# Patient Record
Sex: Male | Born: 1995 | Race: Black or African American | Hispanic: No | Marital: Single | Smoking: Never smoker
Health system: Southern US, Community
[De-identification: ages and names within clinical notes are randomized; demographics above are authoritative.]

## PROBLEM LIST (undated history)

## (undated) DIAGNOSIS — F909 Attention-deficit hyperactivity disorder, unspecified type: Secondary | ICD-10-CM

## (undated) DIAGNOSIS — F840 Autistic disorder: Secondary | ICD-10-CM

---
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
## 2018-06-25 DIAGNOSIS — F39 Unspecified mood [affective] disorder: Secondary | ICD-10-CM | POA: Insufficient documentation

## 2018-07-27 LAB — HM HEPATITIS C SCREENING LAB: HM Hepatitis Screen: NEGATIVE

## 2018-07-27 LAB — HM HIV SCREENING LAB: HM HIV Screening: NEGATIVE

## 2018-09-13 ENCOUNTER — Emergency Department
Admission: EM | Admit: 2018-09-13 | Discharge: 2018-09-13 | Disposition: A | Discharge status: Home / Self Care | Payer: Medicaid Other | Attending: Emergency Medicine | Admitting: Emergency Medicine

## 2018-09-13 ENCOUNTER — Encounter: Payer: Self-pay | Admitting: Emergency Medicine

## 2018-09-13 ENCOUNTER — Emergency Department: Payer: Medicaid Other

## 2018-09-13 ENCOUNTER — Other Ambulatory Visit: Payer: Self-pay

## 2018-09-13 DIAGNOSIS — F840 Autistic disorder: Secondary | ICD-10-CM | POA: Insufficient documentation

## 2018-09-13 DIAGNOSIS — F909 Attention-deficit hyperactivity disorder, unspecified type: Secondary | ICD-10-CM | POA: Insufficient documentation

## 2018-09-13 DIAGNOSIS — M7918 Myalgia, other site: Secondary | ICD-10-CM | POA: Diagnosis present

## 2018-09-13 HISTORY — DX: Attention-deficit hyperactivity disorder, unspecified type: F90.9

## 2018-09-13 HISTORY — DX: Autistic disorder: F84.0

## 2018-09-13 MED ORDER — CYCLOBENZAPRINE HCL 10 MG PO TABS
10.0000 mg | ORAL_TABLET | Freq: Once | ORAL | Status: AC
  Administered 2018-09-13: 09:00:00 10 mg via ORAL
  Filled 2018-09-13: qty 1

## 2018-09-13 MED ORDER — CYCLOBENZAPRINE HCL 10 MG PO TABS: 10.0000 mg | ORAL_TABLET | Freq: Three times a day (TID) | ORAL | 0 refills | Status: DC | PRN

## 2018-09-13 MED ORDER — IBUPROFEN 800 MG PO TABS: 800.0000 mg | ORAL_TABLET | Freq: Three times a day (TID) | ORAL | 0 refills | Status: DC | PRN

## 2018-09-13 MED ORDER — IBUPROFEN 800 MG PO TABS
800.0000 mg | ORAL_TABLET | Freq: Once | ORAL | Status: AC
  Administered 2018-09-13: 800 mg via ORAL
  Filled 2018-09-13: qty 1
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
## 2018-12-13 ENCOUNTER — Telehealth: Payer: Self-pay

## 2018-12-13 NOTE — Telephone Encounter (Signed)
Phone call to pt. Pt counseled that we do not do prep at [facility], but we do have a resource for that. Pt provided name and number of [facility]. Pt states he will give them a call to schedule appt.

## 2018-12-23 ENCOUNTER — Encounter (HOSPITAL_COMMUNITY): Payer: Self-pay | Admitting: Emergency Medicine

## 2018-12-23 ENCOUNTER — Emergency Department (HOSPITAL_COMMUNITY)
Admission: EM | Admit: 2018-12-23 | Discharge: 2018-12-24 | Disposition: A | Discharge status: Home / Self Care | Payer: Medicaid Other | Attending: Emergency Medicine | Admitting: Emergency Medicine

## 2018-12-23 ENCOUNTER — Other Ambulatory Visit: Payer: Self-pay

## 2018-12-23 DIAGNOSIS — R300 Dysuria: Secondary | ICD-10-CM | POA: Diagnosis not present

## 2018-12-23 DIAGNOSIS — R309 Painful micturition, unspecified: Secondary | ICD-10-CM | POA: Diagnosis present

## 2018-12-23 LAB — URINALYSIS, ROUTINE W REFLEX MICROSCOPIC
Bilirubin Urine: NEGATIVE
Glucose, UA: NEGATIVE mg/dL
Hgb urine dipstick: NEGATIVE
Ketones, ur: NEGATIVE mg/dL
Leukocytes,Ua: NEGATIVE
Nitrite: NEGATIVE
Protein, ur: NEGATIVE mg/dL
Specific Gravity, Urine: 1.013 (ref 1.005–1.030)
pH: 7 (ref 5.0–8.0)

## 2018-12-23 MED ORDER — PHENAZOPYRIDINE HCL 200 MG PO TABS: 200.0000 mg | ORAL_TABLET | Freq: Three times a day (TID) | ORAL | 0 refills | Status: DC | PRN

## 2018-12-23 NOTE — Discharge Instructions (Signed)
Ultrasound well-hydrated water.  This is very important. Use Tylenol and ibuprofen as needed for pain. Use Pyridium to help with burning. Try to decrease her caffeine intake, this may help with inflammation. If your symptoms are persisting, follow-up with urologist listed below. Return to the emergency room if you develop high fevers, vomiting, abdominal pain, inability urinate, or any new, worsening, concerning symptoms.

## 2018-12-23 NOTE — ED Triage Notes (Signed)
Pt reports he is having painful urination since last Friday. Pt reports he was seen Sunday and treated for possible STD, told he may have a UTI. Pt reports he is still having pain, denies blood in urine, states his urine was cloudy earlier. Denies malodorous urine.

## 2018-12-24 NOTE — ED Notes (Signed)
Discharge instructions discussed with pt. Pt verbalized understanding. Pt to follow up with urology. No questions at this time .

## 2018-12-24 NOTE — ED Provider Notes (Signed)
[facility] EMERGENCY DEPARTMENT Provider Note   CSN: [id number] Arrival date & time: 12/23/18  2054     History   Chief Complaint Chief Complaint  Patient presents with  . painful urination    HPI Jack Wolf is a 23 y.o. male presenting for evaluation of dysuria.   Patient states that the past 6 days he has been having dysuria.  Pain is at the tip of his penis, happens every time he urinates.  He was seen several days ago, was tested for STDs and treated for STDs.  Urine was tested at that time as well.  Patient states all of his results came back negative, but his symptoms not improving.  Patient states nothing makes it better, he has been trying hydration and cranberry juice.  He has not taken anything else.  He denies lesions or bleeding.  He denies fevers, chills, nausea, vomiting, abdominal pain, urinary frequency, or hematuria.  He states he has no medical problems, takes no medications daily.     HPI  Past Medical History:  Diagnosis Date  . ADHD   . Autism     There are no active problems to display for this patient.   History reviewed. No pertinent surgical history.      Home Medications    Prior to Admission medications   Medication Sig Start Date End Date Taking? Authorizing Provider  cyclobenzaprine (FLEXERIL) 10 MG tablet Take 1 tablet (10 mg total) by mouth 3 (three) times daily as needed for muscle spasms. 09/13/18   Earleen Newport, MD  ibuprofen (ADVIL,MOTRIN) 800 MG tablet Take 1 tablet (800 mg total) by mouth every 8 (eight) hours as needed. 09/13/18   Earleen Newport, MD  methylphenidate 36 MG PO CR tablet Take 36 mg by mouth daily.    [provider]  phenazopyridine (PYRIDIUM) 200 MG tablet Take 1 tablet (200 mg total) by mouth 3 (three) times daily as needed for pain. 12/23/18   Sheretta Grumbine, PA-C    Family History History reviewed. No pertinent family history.  Social History Social History    Tobacco Use  . Smoking status: Never Smoker  . Smokeless tobacco: Never Used  Substance Use Topics  . Alcohol use: Never    Frequency: Never  . Drug use: Never     Allergies   Patient has no known allergies.   Review of Systems Review of Systems  Constitutional: Negative for fever.  Gastrointestinal: Negative for abdominal pain, nausea and vomiting.  Genitourinary: Positive for dysuria. Negative for decreased urine volume, difficulty urinating, discharge, flank pain, frequency, genital sores, hematuria, penile pain, penile swelling, scrotal swelling, testicular pain and urgency.     Physical Exam Updated Vital Signs BP (!) 141/81   Pulse 77   Temp 99.3 F (37.4 C) (Oral)   Resp 17   SpO2 96%   Physical Exam Vitals signs and nursing note reviewed. Exam conducted with a chaperone present.  Constitutional:      General: He is not in acute distress.    Appearance: He is well-developed.     Comments: Sitting comfortably in the bed in no acute distress  HENT:     Head: Normocephalic and atraumatic.  Neck:     Musculoskeletal: Normal range of motion.  Cardiovascular:     Rate and Rhythm: Normal rate and regular rhythm.     Pulses: Normal pulses.  Pulmonary:     Effort: Pulmonary effort is normal.  Breath sounds: Normal breath sounds.  Abdominal:     General: There is no distension.     Palpations: Abdomen is soft. There is no mass.     Tenderness: There is no abdominal tenderness. There is no guarding or rebound.     Hernia: There is no hernia in the left inguinal area or right inguinal area.     Comments: No tenderness palpation the abdomen.  Soft without rigidity, guarding, distention.  Negative rebound.  Genitourinary:    Penis: Normal and circumcised. No phimosis, paraphimosis, hypospadias, erythema, tenderness, discharge, swelling or lesions.      Scrotum/Testes: Normal.        Right: Tenderness or swelling not present.        Left: Tenderness or swelling  not present.     Epididymis:     Right: Normal.     Left: Normal.     Comments: No obvious abnormality of the GU exam.  No lesions.  No discharge.  No swelling or tenderness.  No inguinal lymphadenopathy or hernias. Musculoskeletal: Normal range of motion.  Lymphadenopathy:     Lower Body: No right inguinal adenopathy. No left inguinal adenopathy.  Skin:    General: Skin is warm.     Findings: No rash.  Neurological:     Mental Status: He is alert and oriented to person, place, and time.      ED Treatments / Results  Labs (all labs ordered are listed, but only abnormal results are displayed) Labs Reviewed  URINALYSIS, ROUTINE W REFLEX MICROSCOPIC    EKG None  Radiology No results found.  Procedures Procedures (including critical care time)  Medications Ordered in ED Medications - No data to display   Initial Impression / Assessment and Plan / ED Course  I have reviewed the triage vital signs and the nursing notes.  Pertinent labs & imaging results that were available during my care of the patient were reviewed by me and considered in my medical decision making (see chart for details).        Patient presenting for evaluation of dysuria.  Physical exam reassuring, appears nontoxic.  No obvious abnormality on GU exam.  Patient recently treated for STDs, test were negative.  Urine today is completely normal, doubt UTI.  Doubt kidney stone, there is no blood in his urine.  No obvious skin infection at the glans of the penis.  Discussed dysuria may be due to inflammation instead of infection.  Encouraged hydration.  Will give Pyridium for dysuria, and have patient follow-up with urology if symptoms not improving.  At this time, patient appears safe for discharge.  Return precautions given.  Patient states he understands and agrees to plan.   Final Clinical Impressions(s) / ED Diagnoses   Final diagnoses:  Dysuria    ED Discharge Orders         Ordered     phenazopyridine (PYRIDIUM) 200 MG tablet  3 times daily PRN     12/23/18 2356           Alveria ApleyCaccavale, Sowmya Partridge, PA-C 12/24/18 0019    Nira Connardama, Pedro Eduardo, MD 12/24/18 2026

## 2019-07-04 ENCOUNTER — Encounter: Payer: Self-pay | Admitting: Emergency Medicine

## 2019-07-04 ENCOUNTER — Other Ambulatory Visit: Payer: Self-pay

## 2019-07-04 DIAGNOSIS — Z5321 Procedure and treatment not carried out due to patient leaving prior to being seen by health care provider: Secondary | ICD-10-CM | POA: Insufficient documentation

## 2019-07-04 DIAGNOSIS — R519 Headache, unspecified: Secondary | ICD-10-CM | POA: Diagnosis not present

## 2019-07-04 DIAGNOSIS — R112 Nausea with vomiting, unspecified: Secondary | ICD-10-CM | POA: Diagnosis not present

## 2019-07-04 DIAGNOSIS — J029 Acute pharyngitis, unspecified: Secondary | ICD-10-CM | POA: Insufficient documentation

## 2019-07-04 DIAGNOSIS — R439 Unspecified disturbances of smell and taste: Secondary | ICD-10-CM | POA: Insufficient documentation

## 2019-07-04 DIAGNOSIS — R109 Unspecified abdominal pain: Secondary | ICD-10-CM | POA: Diagnosis not present

## 2019-07-04 LAB — COMPREHENSIVE METABOLIC PANEL
ALT: 32 U/L (ref 0–44)
AST: 23 U/L (ref 15–41)
Albumin: 4.7 g/dL (ref 3.5–5.0)
Alkaline Phosphatase: 89 U/L (ref 38–126)
Anion gap: 12 (ref 5–15)
BUN: 10 mg/dL (ref 6–20)
CO2: 24 mmol/L (ref 22–32)
Calcium: 9.3 mg/dL (ref 8.9–10.3)
Chloride: 103 mmol/L (ref 98–111)
Creatinine, Ser: 1.18 mg/dL (ref 0.61–1.24)
GFR calc Af Amer: >60 mL/min (ref >60)
GFR calc non Af Amer: >60 mL/min (ref >60)
Glucose, Bld: 111 mg/dL — ABNORMAL HIGH (ref 70–99)
Potassium: 3.9 mmol/L (ref 3.5–5.1)
Sodium: 139 mmol/L (ref 135–145)
Total Bilirubin: 0.9 mg/dL (ref 0.3–1.2)
Total Protein: 8.8 g/dL — ABNORMAL HIGH (ref 6.5–8.1)

## 2019-07-04 LAB — CBC
HCT: 44.6 % (ref 39.0–52.0)
Hemoglobin: 15 g/dL (ref 13.0–17.0)
MCH: 26 pg (ref 26.0–34.0)
MCHC: 33.6 g/dL (ref 30.0–36.0)
MCV: 77.3 fL — ABNORMAL LOW (ref 80.0–100.0)
Platelets: 213 10*3/uL (ref 150–400)
RBC: 5.77 MIL/uL (ref 4.22–5.81)
RDW: 13.8 % (ref 11.5–15.5)
WBC: 5.6 10*3/uL (ref 4.0–10.5)
nRBC: 0 % (ref 0.0–0.2)

## 2019-07-04 LAB — LIPASE, BLOOD: Lipase: 22 U/L (ref 11–51)

## 2019-07-04 MED ORDER — SODIUM CHLORIDE 0.9% FLUSH: 3.0000 mL | Freq: Once | INTRAVENOUS | Status: DC

## 2019-07-04 NOTE — ED Triage Notes (Signed)
Pt to ED from home c/o sore throat, headache, loss of taste and smell since last Thursday.  Also states abd pain today with one episodes of nausea and vomiting just after arrival to ED.

## 2019-07-05 ENCOUNTER — Emergency Department
Admission: EM | Admit: 2019-07-05 | Discharge: 2019-07-05 | Disposition: A | Discharge status: Home / Self Care | Payer: Medicaid Other | Attending: Emergency Medicine | Admitting: Emergency Medicine

## 2019-09-22 ENCOUNTER — Other Ambulatory Visit: Payer: Self-pay

## 2019-09-22 ENCOUNTER — Ambulatory Visit: Payer: Medicaid Other | Admitting: Dermatology

## 2019-09-22 DIAGNOSIS — L700 Acne vulgaris: Secondary | ICD-10-CM

## 2019-09-22 DIAGNOSIS — L730 Acne keloid: Secondary | ICD-10-CM

## 2019-09-22 MED ORDER — KETOCONAZOLE 2 % EX SHAM: 1.0000 "application " | MEDICATED_SHAMPOO | CUTANEOUS | 3 refills | Status: DC

## 2019-09-22 MED ORDER — TAZORAC 0.05 % EX CREA: TOPICAL_CREAM | Freq: Every day | CUTANEOUS | 3 refills | Status: DC

## 2019-09-22 MED ORDER — DOXYCYCLINE HYCLATE 100 MG PO CAPS: 100.0000 mg | ORAL_CAPSULE | Freq: Every day | ORAL | 3 refills | Status: DC

## 2019-09-22 NOTE — Progress Notes (Signed)
   New Patient Visit  Subjective  Jack Wolf is a 24 y.o. male who presents for the following: Acne (of face and post scalp. Breakouts may be caused by razors.).    Objective  Well appearing patient in no apparent distress; mood and affect are within normal limits.  A focused examination was performed including face, neck, post scalp. Relevant physical exam findings are noted in the Assessment and Plan.  Objective  Post scalp: Acne papules and keloidal papules.  Images    Objective  Head - Anterior (Face): Pustules, almost confluent.  Images        Assessment & Plan  Acne keloidalis nuchae Post scalp  /Folliculitis  Ordered Medications: ketoconazole (NIZORAL) 2 % shampoo doxycycline (VIBRAMYCIN) 100 MG capsule tazarotene (TAZORAC) 0.05 % cream  Acne vulgaris Head - Anterior (Face)  May consider Isotretinoin if not improving. Information give today.  ketoconazole (NIZORAL) 2 % shampoo - Head - Anterior (Face)  doxycycline (VIBRAMYCIN) 100 MG capsule - Head - Anterior (Face)  tazarotene (TAZORAC) 0.05 % cream - Head - Anterior (Face)  Return in about 2 months (around 11/22/2019).   I, Joanie Coddington, CMA, am acting as scribe for Armida Sans, MD .  Documentation: I have reviewed the above documentation for accuracy and completeness, and I agree with the above.  Armida Sans, MD

## 2019-09-23 ENCOUNTER — Encounter: Payer: Self-pay | Admitting: Dermatology

## 2019-10-05 ENCOUNTER — Telehealth: Payer: Self-pay

## 2019-10-05 NOTE — Telephone Encounter (Signed)
Tazorac not covered with medicaid insurance until two preferred have been tried.  -Duac, Clindamycin Solution, Differin Gel, Epiduo Gel, Erythromycin Solution, Retin-A, Retin-A Micro.

## 2019-10-06 NOTE — Telephone Encounter (Signed)
Send Retin A micro 0.1% cr

## 2019-10-07 MED ORDER — TRETINOIN 0.1 % EX CREA: TOPICAL_CREAM | Freq: Every evening | CUTANEOUS | 2 refills | Status: AC

## 2019-10-07 NOTE — Telephone Encounter (Signed)
Retina-A sent in. Called patient but no answer and no voicemail available.

## 2019-10-10 NOTE — Telephone Encounter (Signed)
Called patient. No answer and no voicemail available.

## 2019-10-11 NOTE — Telephone Encounter (Signed)
Called patient for the third and final time. No answer and no voicemail.

## 2019-10-19 ENCOUNTER — Telehealth: Payer: Self-pay

## 2019-10-19 ENCOUNTER — Other Ambulatory Visit: Payer: Self-pay

## 2019-10-19 MED ORDER — KETOCONAZOLE 2 % EX SHAM: MEDICATED_SHAMPOO | CUTANEOUS | 0 refills | Status: DC

## 2019-10-19 NOTE — Telephone Encounter (Signed)
Refill Ketoconazole 2% shampoo   Ok erx'd Ketoconazole shampoo to [facility]

## 2019-11-15 ENCOUNTER — Ambulatory Visit (LOCAL_COMMUNITY_HEALTH_CENTER): Payer: Medicaid Other

## 2019-11-15 ENCOUNTER — Other Ambulatory Visit: Payer: Self-pay

## 2019-11-15 DIAGNOSIS — Z7185 Encounter for immunization safety counseling: Secondary | ICD-10-CM

## 2019-11-15 DIAGNOSIS — Z7189 Other specified counseling: Secondary | ICD-10-CM

## 2019-11-15 NOTE — Progress Notes (Signed)
Pt to clinic requesting Hep A vaccine. Pt counseled that he already completed Hep A series. RN offered pt Td/Tdap booster; was due April 2021. Pt declined Td and Tdap vaccine today but did accept VIS statement for both to study; plans to reschedule immunization appt. Provided copy of [facility] to pt.

## 2019-11-26 ENCOUNTER — Encounter: Payer: Self-pay | Admitting: Emergency Medicine

## 2019-11-26 ENCOUNTER — Other Ambulatory Visit: Payer: Self-pay

## 2019-11-26 ENCOUNTER — Emergency Department
Admission: EM | Admit: 2019-11-26 | Discharge: 2019-11-26 | Disposition: A | Discharge status: Home / Self Care | Payer: Medicaid Other | Attending: Emergency Medicine | Admitting: Emergency Medicine

## 2019-11-26 DIAGNOSIS — R300 Dysuria: Secondary | ICD-10-CM | POA: Insufficient documentation

## 2019-11-26 DIAGNOSIS — Z79899 Other long term (current) drug therapy: Secondary | ICD-10-CM | POA: Insufficient documentation

## 2019-11-26 DIAGNOSIS — Z113 Encounter for screening for infections with a predominantly sexual mode of transmission: Secondary | ICD-10-CM

## 2019-11-26 LAB — URINALYSIS, COMPLETE (UACMP) WITH MICROSCOPIC
Bacteria, UA: NONE SEEN
Bilirubin Urine: NEGATIVE
Glucose, UA: NEGATIVE mg/dL
Ketones, ur: NEGATIVE mg/dL
Leukocytes,Ua: NEGATIVE
Nitrite: NEGATIVE
Protein, ur: 30 mg/dL — AB
Specific Gravity, Urine: 1.021 (ref 1.005–1.030)
pH: 5 (ref 5.0–8.0)

## 2019-11-26 LAB — CHLAMYDIA/NGC RT PCR (ARMC ONLY)
Chlamydia Tr: NOT DETECTED
N gonorrhoeae: NOT DETECTED

## 2019-11-26 NOTE — ED Notes (Signed)
See triage note  Presents with some penile irritation  No discharge  But states having discomfort with urination

## 2019-11-26 NOTE — ED Triage Notes (Signed)
Pt here for STD test. Has been having irritation at penis. + dysuria but denies discharge. Unsure if he has been exposed to anything, but would like to get tested.

## 2019-11-26 NOTE — ED Provider Notes (Signed)
Jack Wolf Provider Note   ____________________________________________   First MD Initiated Contact with Patient 11/26/19 [phone]     (approximate)  I have reviewed the triage vital signs and the nursing notes.   HISTORY  Chief Complaint STD test   HPI Jack Wolf is a 24 y.o. male presents to the ED with complaint of penile irritation but no discharge.  Patient states that he had unprotected sex approximately 7 to 10 days ago and that his partner is having no symptoms.  Patient denies any other symptoms.      Past Medical History:  Diagnosis Date  . ADHD   . Autism     There are no problems to display for this patient.   History reviewed. No pertinent surgical history.  Prior to Admission medications   Medication Sig Start Date End Date Taking? Authorizing Provider  cetirizine (ZYRTEC) 10 MG tablet Take by mouth.    [provider]  ketoconazole (NIZORAL) 2 % cream Apply topically.    [provider]  ketoconazole (NIZORAL) 2 % shampoo Apply 1 application topically 3 (three) times a week. Wash face and post neck/scalp 09/23/19   Deirdre Evener, MD  ketoconazole (NIZORAL) 2 % shampoo Shampoo scalp 2-3 times a week 10/19/19   Deirdre Evener, MD  methylphenidate 36 MG PO CR tablet Take 36 mg by mouth daily.    [provider]  tazarotene (TAZORAC) 0.05 % cream Apply topically at bedtime. To face and post neck 09/22/19   Deirdre Evener, MD  tretinoin (RETIN-A) 0.1 % cream Apply topically at bedtime. 10/07/19 10/06/20  Deirdre Evener, MD    Allergies Patient has no known allergies.  History reviewed. No pertinent family history.  Social History Social History   Tobacco Use  . Smoking status: Never Smoker  . Smokeless tobacco: Never Used  Substance Use Topics  . Alcohol use: Never  . Drug use: Never    Review of Systems Constitutional: No fever/chills Eyes: No visual  changes. ENT: No sore throat. Cardiovascular: Denies chest pain. Respiratory: Denies shortness of breath. Gastrointestinal: No abdominal pain.  No nausea, no vomiting.  Genitourinary: Positive for penile irritation.  Negative for frequency, discharge or hesitancy. Musculoskeletal: Negative for back pain. Skin: Negative for rash. Neurological: Negative for  focal weakness or numbness. ____________________________________________   PHYSICAL EXAM:  VITAL SIGNS: ED Triage Vitals [11/26/19 0840]  Enc Vitals Group     BP      Pulse      Resp      Temp      Temp src      SpO2      Weight (!) 330 lb (149.7 kg)     Height 5\' 11"  (1.803 m)     Head Circumference      Peak Flow      Pain Score 0     Pain Loc      Pain Edu?      Excl. in GC?     Constitutional: Alert and oriented. Well appearing and in no acute distress. Eyes: Conjunctivae are normal.  Head: Atraumatic. Neck: No stridor.   Cardiovascular: Normal rate, regular rhythm. Grossly normal heart sounds.  Good peripheral circulation. Respiratory: Normal respiratory effort.  No retractions. Lungs CTAB. Musculoskeletal: Moves upper and lower extremities no difficulty.  Normal gait was noted. Neurologic:  Normal speech and language. No gross focal neurologic deficits are appreciated. No gait instability. Skin:  Skin is  warm, dry and intact. Psychiatric: Mood and affect are normal. Speech and behavior are normal.  ____________________________________________   LABS (all labs ordered are listed, but only abnormal results are displayed)  Labs Reviewed  URINALYSIS, COMPLETE (UACMP) WITH MICROSCOPIC - Abnormal; Notable for the following components:      Result Value   Color, Urine YELLOW (*)    APPearance CLEAR (*)    Hgb urine dipstick SMALL (*)    Protein, ur 30 (*)    All other components within normal limits  CHLAMYDIA/NGC RT PCR ([facility] ONLY)   PROCEDURES  Procedure(s) performed (including Critical  Care):  Procedures   ____________________________________________   INITIAL IMPRESSION / ASSESSMENT AND PLAN / ED COURSE  As part of my medical decision making, I reviewed the following data within the electronic MEDICAL RECORD NUMBER Notes from prior ED visits and [place] Controlled Substance Database  47-year-old male presents to the ED with complaint of penile irritation but no discharge.  He states he had unprotected sex approximately 7 to 10 days ago with his partner having no symptoms.  Urinalysis was negative.  Patient was told that we would call if his gonorrhea and chlamydia test was positive.  Patient was asked twice if he gave registration a current phone number where he can be reached and he both times said yes.  This provider tried to call the patient after receiving the results of his test.  The number that he listed as his contact phone was "a nonworking number".  He also was given information about going to the [facility] if any further STD testing was needed.  ____________________________________________   FINAL CLINICAL IMPRESSION(S) / ED DIAGNOSES  Final diagnoses:  Dysuria  Screen for STD (sexually transmitted disease)     ED Discharge Orders    None       Note:  This document was prepared using Dragon voice recognition software and may include unintentional dictation errors.    Johnn Hai, PA-C 11/26/19 1140    Harvest Dark, MD 11/26/19 1539

## 2019-11-26 NOTE — Discharge Instructions (Signed)
Follow-up with the [facility] department STD clinic.  You will need to call and make an appointment.  Your urine test was negative for any obvious infection.  If your chlamydia and gonorrhea test is positive you will get a phone call.

## 2019-12-05 ENCOUNTER — Encounter: Payer: Self-pay | Admitting: Emergency Medicine

## 2019-12-05 ENCOUNTER — Emergency Department: Payer: Medicaid Other

## 2019-12-05 ENCOUNTER — Emergency Department
Admission: EM | Admit: 2019-12-05 | Discharge: 2019-12-05 | Disposition: A | Discharge status: Home / Self Care | Payer: Medicaid Other | Attending: Emergency Medicine | Admitting: Emergency Medicine

## 2019-12-05 ENCOUNTER — Other Ambulatory Visit: Payer: Self-pay

## 2019-12-05 DIAGNOSIS — J069 Acute upper respiratory infection, unspecified: Secondary | ICD-10-CM | POA: Diagnosis not present

## 2019-12-05 DIAGNOSIS — F840 Autistic disorder: Secondary | ICD-10-CM | POA: Diagnosis not present

## 2019-12-05 DIAGNOSIS — R0981 Nasal congestion: Secondary | ICD-10-CM | POA: Diagnosis present

## 2019-12-05 MED ORDER — PSEUDOEPH-BROMPHEN-DM 30-2-10 MG/5ML PO SYRP: 5.0000 mL | ORAL_SOLUTION | Freq: Four times a day (QID) | ORAL | 0 refills | Status: DC | PRN

## 2019-12-05 NOTE — ED Notes (Signed)
See triage note  Presents with cough  States developed fever,sore throat and cough last weds  Afebrile on arrival

## 2019-12-05 NOTE — ED Provider Notes (Signed)
[facility] Emergency Department Provider Note  ____________________________________________   First MD Initiated Contact with Patient 12/05/19 1325     (approximate)  I have reviewed the triage vital signs and the nursing notes.   HISTORY  Chief Complaint Cough and Nasal Congestion    HPI Jack Wolf is a 24 y.o. male presents to the ED with complaint of nasal congestion, nonproductive cough, fever last week along with headache and sore throat.  Patient states that he is taken some over-the-counter allergy medicine for allergies.  He denies being a smoker.  He also denies any GI symptoms and no exposure to Covid.  He denies any loss of taste or smell and continues to have an appetite.       Past Medical History:  Diagnosis Date  . ADHD   . Autism     There are no problems to display for this patient.   History reviewed. No pertinent surgical history.  Prior to Admission medications   Medication Sig Start Date End Date Taking? Authorizing Provider  brompheniramine-pseudoephedrine-DM 30-2-10 MG/5ML syrup Take 5 mLs by mouth 4 (four) times daily as needed. 12/05/19   Tommi Rumps, PA-C  cetirizine (ZYRTEC) 10 MG tablet Take by mouth.    [provider]  ketoconazole (NIZORAL) 2 % cream Apply topically.    [provider]  ketoconazole (NIZORAL) 2 % shampoo Apply 1 application topically 3 (three) times a week. Wash face and post neck/scalp 09/23/19   Deirdre Evener, MD  ketoconazole (NIZORAL) 2 % shampoo Shampoo scalp 2-3 times a week 10/19/19   Deirdre Evener, MD  methylphenidate 36 MG PO CR tablet Take 36 mg by mouth daily.    [provider]  tazarotene (TAZORAC) 0.05 % cream Apply topically at bedtime. To face and post neck 09/22/19   Deirdre Evener, MD  tretinoin (RETIN-A) 0.1 % cream Apply topically at bedtime. 10/07/19 10/06/20  Deirdre Evener, MD    Allergies Patient has no known allergies.  History  reviewed. No pertinent family history.  Social History Social History   Tobacco Use  . Smoking status: Never Smoker  . Smokeless tobacco: Never Used  Substance Use Topics  . Alcohol use: Never  . Drug use: Never    Review of Systems Constitutional: No fever/chills Eyes: No visual changes. ENT: Positive sore throat.  Positive nasal congestion. Cardiovascular: Denies chest pain. Respiratory: Denies shortness of breath.  Positive nonproductive cough. Gastrointestinal: No abdominal pain.  No nausea, no vomiting.  No diarrhea.  Musculoskeletal: Negative for back pain. Skin: Negative for rash. Neurological: Positive for headaches, negative for focal weakness or numbness.  ____________________________________________   PHYSICAL EXAM:  VITAL SIGNS: ED Triage Vitals  Enc Vitals Group     BP 12/05/19 1237 (!) 146/82     Pulse Rate 12/05/19 1237 85     Resp 12/05/19 1237 18     Temp 12/05/19 1237 98.9 F (37.2 C)     Temp Source 12/05/19 1237 Oral     SpO2 12/05/19 1237 100 %     Weight 12/05/19 1232 (!) 320 lb (145.2 kg)     Height 12/05/19 1232 5\' 11"  (1.803 m)     Head Circumference --      Peak Flow --      Pain Score 12/05/19 1232 7     Pain Loc --      Pain Edu? --      Excl. in GC? --  Constitutional: Alert and oriented. Well appearing and in no acute distress. Eyes: Conjunctivae are normal.  Head: Atraumatic. Nose: Mild congestion/no present rhinnorhea. Mouth/Throat: Mucous membranes are moist.  Oropharynx non-erythematous.  Uvula is midline. Neck: No stridor.   Cardiovascular: Normal rate, regular rhythm. Grossly normal heart sounds.  Good peripheral circulation. Respiratory: Normal respiratory effort.  No retractions. Lungs CTAB. Gastrointestinal: Soft and nontender. No distention.  Musculoskeletal: Moves upper and lower extremities with any difficulty.  Normal gait was noted. Neurologic:  Normal speech and language. No gross focal neurologic deficits are  appreciated. No gait instability. Skin:  Skin is warm, dry and intact. No rash noted. Psychiatric: Mood and affect are normal. Speech and behavior are normal.  ____________________________________________   LABS (all labs ordered are listed, but only abnormal results are displayed)  Labs Reviewed - No data to display  RADIOLOGY  Official radiology report(s): DG Chest 2 View  Result Date: 12/05/2019 CLINICAL DATA:  Cough and nasal congestion. EXAM: CHEST - 2 VIEW COMPARISON:  09/13/2018 FINDINGS: The heart size and mediastinal contours are within normal limits. Both lungs are clear. The visualized skeletal structures are unremarkable. IMPRESSION: No active cardiopulmonary disease. Electronically Signed   By: Norva Pavlov M.D.   On: 12/05/2019 13:14    ____________________________________________   PROCEDURES  Procedure(s) performed (including Critical Care):  Procedures   ____________________________________________   INITIAL IMPRESSION / ASSESSMENT AND PLAN / ED COURSE  As part of my medical decision making, I reviewed the following data within the electronic MEDICAL RECORD NUMBER Notes from prior ED visits and [place] Controlled Substance Database  24 year old male presents to the ED with complaint of upper respiratory symptoms with history of nasal congestion, nonproductive cough sore throat and headache.  Patient states that he had a subjective fever last week.  He denies any loss of taste or smell.  He denies any exposure to Covid.  He has taken over-the-counter allergy medication with minimal relief.  No coughing was noted while patient was present in the ED.  Exam is consistent with a viral URI.  Patient is encouraged to increase fluids, take Tylenol if needed and Bromfed DM was sent to his pharmacy.  He is to follow-up with his PCP if any continued problems.   ____________________________________________   FINAL CLINICAL IMPRESSION(S) / ED DIAGNOSES  Final diagnoses:    Viral URI with cough     ED Discharge Orders         Ordered    brompheniramine-pseudoephedrine-DM 30-2-10 MG/5ML syrup  4 times daily PRN     Discontinue  Reprint     12/05/19 1407           Note:  This document was prepared using Dragon voice recognition software and may include unintentional dictation errors.    Tommi Rumps, PA-C 12/05/19 1458    Sharman Cheek, MD 12/05/19 [phone]

## 2019-12-05 NOTE — ED Triage Notes (Signed)
Here for nasal congestion, non productive cough, fever last Wednesday, headache and sore throat. Unlabored, NAD at this time.  Chest wall pain with cough, no pain prior to cough.

## 2019-12-05 NOTE — Discharge Instructions (Signed)
Follow-up with your primary care provider if any continued problems or concerns.  Begin taking the Bromfed-DM as needed for cough, congestion and sore throat.  Increase fluids.  Tylenol as needed for sore throat.

## 2019-12-12 ENCOUNTER — Other Ambulatory Visit: Payer: Self-pay

## 2019-12-12 ENCOUNTER — Ambulatory Visit (INDEPENDENT_AMBULATORY_CARE_PROVIDER_SITE_OTHER): Payer: Medicaid Other | Admitting: Dermatology

## 2019-12-12 DIAGNOSIS — L700 Acne vulgaris: Secondary | ICD-10-CM | POA: Diagnosis not present

## 2019-12-12 DIAGNOSIS — L730 Acne keloid: Secondary | ICD-10-CM | POA: Diagnosis not present

## 2019-12-12 MED ORDER — KETOCONAZOLE 2 % EX SHAM: 1.0000 "application " | MEDICATED_SHAMPOO | CUTANEOUS | 3 refills | Status: DC

## 2019-12-12 NOTE — Progress Notes (Signed)
   Follow-Up Visit   Subjective  Jack Wolf is a 24 y.o. male who presents for the following: Acne (2 months f/u on acne pt taking Doxycycline 100 mg qd, Tazorac cream qhs ) and AKN (2 months f/u on his scalp pt using Ketoconazole shampoo with a fair response ).  The following portions of the chart were reviewed this encounter and updated as appropriate:  Tobacco  Allergies  Meds  Problems  Med Hx  Surg Hx  Fam Hx     Review of Systems:  No other skin or systemic complaints except as noted in HPI or Assessment and Plan.  Objective  Well appearing patient in no apparent distress; mood and affect are within normal limits.  A focused examination was performed including face, scalp . Relevant physical exam findings are noted in the Assessment and Plan.  Objective  Head - Anterior (Face): >20 pustules on the chin    Assessment & Plan    Acne vulgaris - improved, but persistent and severe Head - Anterior (Face)  Discussed Isotretinoin therapy and side effects   Patient registered in I pledge program  Ipledge #[id number] Pharmacy [facility] [address] [place]  Cont Doxycycline 100 mg qd Cont Tazorac cream qhs  D/C all acne medications when starting Isotretinoin therapy   Other Related Procedures Comprehensive metabolic panel Lipid panel  Reordered Medications ketoconazole (NIZORAL) 2 % shampoo  Acne keloidalis nuchae posterior scalp Treatment as above and - Reordered Medications ketoconazole (NIZORAL) 2 % shampoo  Return in about 30 days (around 01/11/2020).   IAngelique Holm, CMA, am acting as scribe for Armida Sans, MD .  Documentation: I have reviewed the above documentation for accuracy and completeness, and I agree with the above.  Armida Sans, MD

## 2019-12-13 ENCOUNTER — Encounter: Payer: Self-pay | Admitting: Dermatology

## 2019-12-15 DIAGNOSIS — F39 Unspecified mood [affective] disorder: Secondary | ICD-10-CM

## 2019-12-19 ENCOUNTER — Ambulatory Visit: Payer: Medicaid Other

## 2019-12-28 ENCOUNTER — Other Ambulatory Visit: Payer: Self-pay

## 2019-12-28 ENCOUNTER — Ambulatory Visit: Payer: Medicaid Other | Admitting: Family Medicine

## 2019-12-28 DIAGNOSIS — Z113 Encounter for screening for infections with a predominantly sexual mode of transmission: Secondary | ICD-10-CM | POA: Diagnosis not present

## 2020-01-03 ENCOUNTER — Encounter: Payer: Self-pay | Admitting: Family Medicine

## 2020-01-03 NOTE — Progress Notes (Signed)
   [facility] Department STI clinic/screening visit  Subjective:  Jack Wolf is a 24 y.o. male being seen today for an STI screening visit. The patient reports they do not have symptoms.    Patient has the following medical conditions:   Patient Active Problem List   Diagnosis Date Noted  . Mood disorder (HCC) 06/25/2018     Chief Complaint  Patient presents with  . SEXUALLY TRANSMITTED DISEASE    screening     HPI  Patient reports in clinic for HIV and syphilis testing only.  Patient was seen in June at [facility] and tested negative for CT/ GC but did not have blood work done.  Patient reports no sexual contact in last few months.  Patient reports takes Truvada.   Patient denies any signs and symptoms, last HIV test was in 2020.     See flowsheet for further details and programmatic requirements.    The following portions of the patient's history were reviewed and updated as appropriate: allergies, current medications, past medical history, past social history, past surgical history and problem list.  Objective:  There were no vitals filed for this visit.   Patient declined exam.   Physical Exam Constitutional:      Appearance: Normal appearance. He is obese.  Neurological:     Mental Status: He is alert.  Psychiatric:        Mood and Affect: Mood normal.        Behavior: Behavior normal.        Assessment and Plan:  Jack Wolf is a 24 y.o. male presenting to the [facility] Department for STI screening  1. Screening examination for venereal disease  - HIV [facility] - Syphilis Serology, [facility]   Patient reports no signs / symptoms.  Declines exam today.  Patient was recently seen at [facility] and only wants blood work today.   Patient will be called if any abnormal labs.  Patient questions answered.  Patient verbalizes understanding.      Return As needed for screening.  Future Appointments  Date Time Provider  Department Center  01/16/2020  3:45 PM Deirdre Evener, MD [facility] None    Wendi Snipes, RN

## 2020-01-04 NOTE — Progress Notes (Signed)
Attestation of Attending Supervision of Enhanced Role Nurse:  At public health departments, Enhanced Role Nurses work under standing orders and procedures to provide STI related screening services.. I agree with the care provided to this patient and was available for any consultation as needed.  I have reviewed the RN's note and chart.  I was not consulted by the RN for the patient. If consulted documentation of consult is reflected in the RN's documentation.  ° °Blanchie Zeleznik [place] Irie Fiorello, MD, MPH, ABFM °Medical Director  °[facility].  ° °

## 2020-01-16 ENCOUNTER — Ambulatory Visit: Payer: Medicaid Other | Admitting: Dermatology

## 2020-01-18 ENCOUNTER — Other Ambulatory Visit: Payer: Self-pay

## 2020-01-18 ENCOUNTER — Ambulatory Visit: Payer: Medicaid Other | Admitting: Dermatology

## 2020-01-18 DIAGNOSIS — L730 Acne keloid: Secondary | ICD-10-CM

## 2020-01-18 DIAGNOSIS — L700 Acne vulgaris: Secondary | ICD-10-CM

## 2020-01-18 MED ORDER — KETOCONAZOLE 2 % EX SHAM: 1.0000 "application " | MEDICATED_SHAMPOO | CUTANEOUS | 0 refills | Status: DC

## 2020-01-18 NOTE — Progress Notes (Signed)
Patient requesting RF of Ketoconazole Shampoo.

## 2020-02-02 ENCOUNTER — Ambulatory Visit: Payer: Medicaid Other | Admitting: Dermatology

## 2020-02-11 NOTE — Progress Notes (Signed)
See [facility] documentation and MD attestation.  MD attestation has been signed.  Will sign document for MD as MD is currently on leave.

## 2020-02-15 ENCOUNTER — Ambulatory Visit: Payer: Medicaid Other | Admitting: Dermatology

## 2020-02-16 ENCOUNTER — Ambulatory Visit: Payer: Medicaid Other | Admitting: Dermatology

## 2020-04-06 DIAGNOSIS — Z20822 Contact with and (suspected) exposure to covid-19: Secondary | ICD-10-CM | POA: Diagnosis not present

## 2020-04-06 DIAGNOSIS — R110 Nausea: Secondary | ICD-10-CM | POA: Insufficient documentation

## 2020-04-06 DIAGNOSIS — R10819 Abdominal tenderness, unspecified site: Secondary | ICD-10-CM | POA: Insufficient documentation

## 2020-04-06 DIAGNOSIS — F840 Autistic disorder: Secondary | ICD-10-CM | POA: Insufficient documentation

## 2020-04-07 ENCOUNTER — Encounter: Payer: Self-pay | Admitting: Emergency Medicine

## 2020-04-07 ENCOUNTER — Other Ambulatory Visit: Payer: Self-pay

## 2020-04-07 ENCOUNTER — Emergency Department
Admission: EM | Admit: 2020-04-07 | Discharge: 2020-04-07 | Disposition: A | Discharge status: Home / Self Care | Payer: Medicaid Other | Attending: Emergency Medicine | Admitting: Emergency Medicine

## 2020-04-07 DIAGNOSIS — R110 Nausea: Secondary | ICD-10-CM

## 2020-04-07 LAB — RESPIRATORY PANEL BY RT PCR (FLU A&B, COVID)
Influenza A by PCR: NEGATIVE
Influenza B by PCR: NEGATIVE
SARS Coronavirus 2 by RT PCR: NEGATIVE

## 2020-04-07 MED ORDER — PANTOPRAZOLE SODIUM 20 MG PO TBEC: 20.0000 mg | DELAYED_RELEASE_TABLET | Freq: Every day | ORAL | 1 refills | Status: DC

## 2020-04-07 MED ORDER — ONDANSETRON 4 MG PO TBDP
4.0000 mg | ORAL_TABLET | Freq: Once | ORAL | Status: AC
  Administered 2020-04-07: 4 mg via ORAL
  Filled 2020-04-07: qty 1

## 2020-04-07 MED ORDER — ONDANSETRON 4 MG PO TBDP: 4.0000 mg | ORAL_TABLET | Freq: Three times a day (TID) | ORAL | 0 refills | Status: DC | PRN

## 2020-04-07 NOTE — ED Triage Notes (Signed)
Patient ambulatory to triage with complaints of neck and shoulder aches and nausea around foo dfor the last 2 days.  periumbilicus aching pain 7/10     Pt reports N but still able to eat   Pt reports taking HIV prevention "prep" medications     Speaking in complete coherent sentences. No acute breathing distress noted.

## 2020-04-07 NOTE — ED Provider Notes (Signed)
[facility] Emergency Department Provider Note  Time seen: 6:42 AM  I have reviewed the triage vital signs and the nursing notes.   HISTORY  Chief Complaint Abdominal Pain and Nausea   HPI Jack Wolf is a 24 y.o. male with a past medical history of ADHD, autism, presents to the emergency department for nausea.  According to the patient for the past 4 days he has been experiencing intermittent nausea and an empty feeling in his stomach.  States it is not "painful" but it feels like when he eats he has to keep eating.  Also states when he smells food he will get nauseated.  Yesterday began experiencing some body aches as well.  Patient denies any fever cough or shortness of breath.  No diarrhea.   Past Medical History:  Diagnosis Date  . ADHD   . Autism     Patient Active Problem List   Diagnosis Date Noted  . Mood disorder (HCC) 06/25/2018    History reviewed. No pertinent surgical history.  Prior to Admission medications   Medication Sig Start Date End Date Taking? Authorizing Provider  brompheniramine-pseudoephedrine-DM 30-2-10 MG/5ML syrup Take 5 mLs by mouth 4 (four) times daily as needed. 12/05/19   Tommi Rumps, PA-C  cetirizine (ZYRTEC) 10 MG tablet Take by mouth.    [provider]  ketoconazole (NIZORAL) 2 % cream Apply topically.    [provider]  ketoconazole (NIZORAL) 2 % shampoo Shampoo scalp 2-3 times a week 10/19/19   Deirdre Evener, MD  ketoconazole (NIZORAL) 2 % shampoo Apply 1 application topically 3 (three) times a week. Wash face and post neck/scalp 01/18/20   Deirdre Evener, MD  methylphenidate 36 MG PO CR tablet Take 36 mg by mouth daily.    [provider]  tretinoin (RETIN-A) 0.1 % cream Apply topically at bedtime. 10/07/19 10/06/20  Deirdre Evener, MD    No Known Allergies  History reviewed. No pertinent family history.  Social History Social History   Tobacco Use  . Smoking status:  Never Smoker  . Smokeless tobacco: Never Used  Substance Use Topics  . Alcohol use: Never  . Drug use: Never    Review of Systems Constitutional: Negative for fever. Cardiovascular: Negative for chest pain. Respiratory: Negative for shortness of breath. Gastrointestinal: Negative for abdominal pain.  Positive for nausea.  Negative for diarrhea. Genitourinary: Negative for urinary compaints Musculoskeletal: Negative for musculoskeletal complaints Neurological: Negative for headache All other ROS negative  ____________________________________________   PHYSICAL EXAM:  VITAL SIGNS: ED Triage Vitals  Enc Vitals Group     BP 04/06/20 2343 (!) 154/73     Pulse Rate 04/06/20 2343 77     Resp 04/06/20 2343 18     Temp 04/06/20 2343 98.3 F (36.8 C)     Temp Source 04/06/20 2343 Oral     SpO2 04/06/20 2343 100 %     Weight 04/06/20 2344 (!) 323 lb (146.5 kg)     Height 04/06/20 2344 5\' 11"  (1.803 m)     Head Circumference --      Peak Flow --      Pain Score 04/07/20 0028 7     Pain Loc --      Pain Edu? --      Excl. in GC? --    Constitutional: Alert and oriented. Well appearing and in no distress. Eyes: Normal exam ENT      Head: Normocephalic and atraumatic.  Mouth/Throat: Mucous membranes are moist. Cardiovascular: Normal rate, regular rhythm.  Respiratory: Normal respiratory effort without tachypnea nor retractions. Breath sounds are clear  Gastrointestinal: Slight tenderness in all quadrants although no reaction to deep palpation.  No rebound guarding or distention.  Obese. Musculoskeletal: Nontender with normal range of motion in all extremities. Neurologic:  Normal speech and language. No gross focal neurologic deficits Skin:  Skin is warm, dry and intact.  Psychiatric: Mood and affect are normal.   ____________________________________________   INITIAL IMPRESSION / ASSESSMENT AND PLAN / ED COURSE  Pertinent labs & imaging results that were available  during my care of the patient were reviewed by me and considered in my medical decision making (see chart for details).   Patient presents to the emergency department for nausea over the past 4 days.  Patient was complaining of abdominal discomfort where he described as an empty sensation when eating.  Denies any "pain."  Discussed with the patient plan of checking lab work including blood work urinalysis given his body aches a Covid test and treating his nausea.  Patient states he does not want the lab work performed but would like to be tested for Covid and treated for nausea.  As the patient overall appears well with a overall reassuring physical exam and is refusing lab work we will check for Covid and treat the patient's nausea.  Patient is vaccinated against Covid.  I discussed return precautions for any development/worsening of abdominal pain, development of fever.  Jack Wolf was evaluated in Emergency Department on 04/07/2020 for the symptoms described in the history of present illness. He was evaluated in the context of the global COVID-19 pandemic, which necessitated consideration that the patient might be at risk for infection with the SARS-CoV-2 virus that causes COVID-19. Institutional protocols and algorithms that pertain to the evaluation of patients at risk for COVID-19 are in a state of rapid change based on information released by regulatory bodies including the CDC and federal and state organizations. These policies and algorithms were followed during the patient's care in the ED.  ____________________________________________   FINAL CLINICAL IMPRESSION(S) / ED DIAGNOSES  Nausea   Minna Antis, MD 04/07/20 [phone]

## 2020-04-09 ENCOUNTER — Other Ambulatory Visit: Payer: Self-pay

## 2020-04-09 ENCOUNTER — Emergency Department
Admission: EM | Admit: 2020-04-09 | Discharge: 2020-04-09 | Disposition: A | Discharge status: Home / Self Care | Payer: Medicaid Other | Attending: Emergency Medicine | Admitting: Emergency Medicine

## 2020-04-09 DIAGNOSIS — F840 Autistic disorder: Secondary | ICD-10-CM | POA: Insufficient documentation

## 2020-04-09 DIAGNOSIS — R110 Nausea: Secondary | ICD-10-CM | POA: Diagnosis not present

## 2020-04-09 DIAGNOSIS — R519 Headache, unspecified: Secondary | ICD-10-CM

## 2020-04-09 MED ORDER — METOCLOPRAMIDE HCL 10 MG PO TABS: 10.0000 mg | ORAL_TABLET | Freq: Three times a day (TID) | ORAL | 1 refills | Status: AC

## 2020-04-09 MED ORDER — METOCLOPRAMIDE HCL 10 MG PO TABS
10.0000 mg | ORAL_TABLET | Freq: Once | ORAL | Status: AC
  Administered 2020-04-09: 10 mg via ORAL
  Filled 2020-04-09: qty 1

## 2020-04-09 MED ORDER — KETOROLAC TROMETHAMINE 60 MG/2ML IM SOLN
30.0000 mg | Freq: Once | INTRAMUSCULAR | Status: AC
  Administered 2020-04-09: 30 mg via INTRAMUSCULAR
  Filled 2020-04-09: qty 2

## 2020-04-09 MED ORDER — KETOROLAC TROMETHAMINE 10 MG PO TABS: 10.0000 mg | ORAL_TABLET | Freq: Four times a day (QID) | ORAL | 0 refills | Status: DC | PRN

## 2020-04-09 NOTE — ED Triage Notes (Signed)
Pt c/o nausea with HA for the past 4 days and was seen here 2 days ago with emesis that has stopped just continues to have the HA and nausea, denies vomiting

## 2020-04-09 NOTE — ED Provider Notes (Signed)
[facility] Emergency Department Provider Note ____________________________________________  Time seen: Approximately 11:44 AM  I have reviewed the triage vital signs and the nursing notes.   HISTORY  Chief Complaint Headache   HPI Jack Wolf is a 24 y.o. male who presents to the emergency department for treatment and evaluation of headache and nausea.   He was evaluated for the same 2 days ago.  The medication that was provided at that time has helped some.  He also feels that he needs a work excuse for today.  Location: Central forehead Similar to previous headaches: Yes Duration: Constant TIMING: 2 days SEVERITY: 7 on 10 QUALITY: Throb CONTEXT: No known exposure MODIFYING FACTORS: Medications prescribed at last visit ASSOCIATED SYMPTOMS: Nausea Past Medical History:  Diagnosis Date   ADHD    Autism     Patient Active Problem List   Diagnosis Date Noted   Mood disorder (HCC) 06/25/2018    History reviewed. No pertinent surgical history.  Prior to Admission medications   Medication Sig Start Date End Date Taking? Authorizing Provider  brompheniramine-pseudoephedrine-DM 30-2-10 MG/5ML syrup Take 5 mLs by mouth 4 (four) times daily as needed. 12/05/19   Tommi Rumps, PA-C  cetirizine (ZYRTEC) 10 MG tablet Take by mouth.    [provider]  ketoconazole (NIZORAL) 2 % cream Apply topically.    [provider]  ketoconazole (NIZORAL) 2 % shampoo Shampoo scalp 2-3 times a week 10/19/19   Deirdre Evener, MD  ketoconazole (NIZORAL) 2 % shampoo Apply 1 application topically 3 (three) times a week. Wash face and post neck/scalp 01/18/20   Deirdre Evener, MD  ketorolac (TORADOL) 10 MG tablet Take 1 tablet (10 mg total) by mouth every 6 (six) hours as needed. 04/09/20   Pasty Manninen, Rulon Eisenmenger B, FNP  methylphenidate 36 MG PO CR tablet Take 36 mg by mouth daily.    [provider]  metoCLOPramide (REGLAN) 10 MG tablet Take 1  tablet (10 mg total) by mouth 3 (three) times daily with meals. 04/09/20 04/09/21  Dontaye Hur, Rulon Eisenmenger B, FNP  ondansetron (ZOFRAN ODT) 4 MG disintegrating tablet Take 1 tablet (4 mg total) by mouth every 8 (eight) hours as needed for nausea or vomiting. 04/07/20   Minna Antis, MD  pantoprazole (PROTONIX) 20 MG tablet Take 1 tablet (20 mg total) by mouth daily. 04/07/20 04/07/21  Minna Antis, MD  tretinoin (RETIN-A) 0.1 % cream Apply topically at bedtime. 10/07/19 10/06/20  Deirdre Evener, MD    Allergies Patient has no known allergies.  No family history on file.  Social History Social History   Tobacco Use   Smoking status: Never Smoker   Smokeless tobacco: Never Used  Substance Use Topics   Alcohol use: Never   Drug use: Never    Review of Systems Constitutional: No fever/chills or recent injury. Eyes: No visual changes. ENT: No sore throat. Respiratory: Denies shortness of breath. Gastrointestinal: No abdominal pain.  No nausea, no vomiting.  No diarrhea.  No constipation. Musculoskeletal: Negative for pain. Skin: Negative for rash. Neurological:Positive for headache, negative for focal weakness or numbness. No confusion or fainting. ___________________________________________   PHYSICAL EXAM:  VITAL SIGNS: ED Triage Vitals  Enc Vitals Group     BP 04/09/20 1016 (!) 148/92     Pulse Rate 04/09/20 1016 80     Resp 04/09/20 1016 17     Temp 04/09/20 1016 98.3 F (36.8 C)     Temp Source 04/09/20 1016 Oral  SpO2 04/09/20 1016 100 %     Weight 04/09/20 1017 (!) 320 lb (145.2 kg)     Height 04/09/20 1017 5\' 11"  (1.803 m)     Head Circumference --      Peak Flow --      Pain Score 04/09/20 1017 7     Pain Loc --      Pain Edu? --      Excl. in GC? --     Constitutional: Alert and oriented. Well appearing and in no acute distress. Eyes: Conjunctivae are normal. PERRL. EOMI without expressed pain. No evidence of papilledema on limited exam. Head:  Atraumatic. Nose: No congestion/rhinnorhea. Mouth/Throat: Mucous membranes are moist.  Oropharynx non-erythematous. Neck: No stridor. Supple, no meningismus.  Cardiovascular: Normal rate, regular rhythm. Grossly normal heart sounds.  Good peripheral circulation. Respiratory: Normal respiratory effort.  No retractions. Lungs CTAB. Gastrointestinal: Soft and nontender. No distention.  Musculoskeletal: No lower extremity tenderness nor edema.  No joint effusions. Neurologic:  Normal speech and language. No gross focal neurologic deficits are appreciated. No gait instability. Cranial nerves: 2-10 normal as tested. Cerebellar:Normal Romberg, finger-nose-finger, heel to shin, normal gait. Sensorimotor: No aphasia, pronator drift, clonus, sensory loss or abnormal reflexes.  Skin:  Skin is warm, dry and intact. No rash noted. Psychiatric: Mood and affect are normal. Speech and behavior are normal. Normal thought process and cognition.  ____________________________________________   LABS (all labs ordered are listed, but only abnormal results are displayed)  Labs Reviewed - No data to display ____________________________________________  EKG  Not indicated. ____________________________________________  RADIOLOGY  No results found. ____________________________________________   PROCEDURES  Procedure(s) performed:  Procedures  Critical Care performed: None ____________________________________________   INITIAL IMPRESSION / ASSESSMENT AND PLAN / ED COURSE  24 year old male presenting to the emergency department for treatment and evaluation of headache with nausea.  Exam is reassuring.  Plan will be to give him Reglan and Toradol and reevaluate.  ER chart reviewed from previous visit.  Patient states that his headache and nausea are gone after Toradol and Reglan.  He will be given prescriptions for the same upon discharge.  He was encouraged to call and schedule follow-up appoint  with his primary care provider if his symptoms return.  Pertinent labs & imaging results that were available during my care of the patient were reviewed by me and considered in my medical decision making (see chart for details). ____________________________________________   FINAL CLINICAL IMPRESSION(S) / ED DIAGNOSES  Final diagnoses:  Acute intractable headache, unspecified headache type  Nausea    ED Discharge Orders         Ordered    metoCLOPramide (REGLAN) 10 MG tablet  3 times daily with meals        04/09/20 1328    ketorolac (TORADOL) 10 MG tablet  Every 6 hours PRN       Note to Pharmacy: Injection given in ER   04/09/20 1328           13/08/21, FNP 04/09/20 1617    13/08/21, MD 04/09/20 2054

## 2020-04-09 NOTE — ED Notes (Signed)
E-signature not working at this time. Pt verbalized understanding of D/C instructions, prescriptions and follow up care with no further questions at this time. Pt in NAD and ambulatory at time of D/C.  

## 2020-06-01 ENCOUNTER — Other Ambulatory Visit: Payer: Self-pay

## 2020-06-01 ENCOUNTER — Emergency Department
Admission: EM | Admit: 2020-06-01 | Discharge: 2020-06-01 | Disposition: A | Discharge status: Home / Self Care | Payer: Medicaid Other | Attending: Student in an Organized Health Care Education/Training Program | Admitting: Student in an Organized Health Care Education/Training Program

## 2020-06-01 DIAGNOSIS — F840 Autistic disorder: Secondary | ICD-10-CM | POA: Insufficient documentation

## 2020-06-01 DIAGNOSIS — J069 Acute upper respiratory infection, unspecified: Secondary | ICD-10-CM | POA: Diagnosis not present

## 2020-06-01 DIAGNOSIS — R070 Pain in throat: Secondary | ICD-10-CM | POA: Diagnosis present

## 2020-06-01 DIAGNOSIS — Z20822 Contact with and (suspected) exposure to covid-19: Secondary | ICD-10-CM | POA: Diagnosis not present

## 2020-06-01 LAB — RESP PANEL BY RT-PCR (FLU A&B, COVID) ARPGX2
Influenza A by PCR: NEGATIVE
Influenza B by PCR: NEGATIVE
SARS Coronavirus 2 by RT PCR: NEGATIVE

## 2020-06-01 LAB — GROUP A STREP BY PCR: Group A Strep by PCR: NOT DETECTED

## 2020-06-01 MED ORDER — FLUTICASONE PROPIONATE 50 MCG/ACT NA SUSP: 2.0000 | Freq: Every day | NASAL | 0 refills | Status: DC

## 2020-06-01 MED ORDER — BENZONATATE 100 MG PO CAPS: 100.0000 mg | ORAL_CAPSULE | Freq: Three times a day (TID) | ORAL | 0 refills | Status: AC | PRN

## 2020-06-01 NOTE — ED Provider Notes (Signed)
[facility] Emergency Department Provider Note  ____________________________________________  Time seen: Approximately 11:12 AM  I have reviewed the triage vital signs and the nursing notes.   HISTORY  Chief Complaint Sore Throat    HPI Jack Wolf is a 24 y.o. male that presents to the emergency department for evaluation of nasal congestion, sore throat, occasional productive cough for 2 days.  Patient denies any sick contacts.  He received both doses of the Moderna vaccine in May and June 2021.  Patient presents today for Covid testing and is wondering if this could be Covid or just a cold.  No shortness of breath, chest pain, nausea, vomiting, abdominal pain, diarrhea, body aches.  Past Medical History:  Diagnosis Date  . ADHD   . Autism     Patient Active Problem List   Diagnosis Date Noted  . Mood disorder (HCC) 06/25/2018    History reviewed. No pertinent surgical history.  Prior to Admission medications   Medication Sig Start Date End Date Taking? Authorizing Provider  benzonatate (TESSALON PERLES) 100 MG capsule Take 1 capsule (100 mg total) by mouth 3 (three) times daily as needed. 06/01/20 06/01/21 Yes Enid Derry, PA-C  fluticasone (FLONASE) 50 MCG/ACT nasal spray Place 2 sprays into both nostrils daily. 06/01/20 06/01/21 Yes Enid Derry, PA-C  brompheniramine-pseudoephedrine-DM 30-2-10 MG/5ML syrup Take 5 mLs by mouth 4 (four) times daily as needed. 12/05/19   Tommi Rumps, PA-C  cetirizine (ZYRTEC) 10 MG tablet Take by mouth.    [provider]  ketoconazole (NIZORAL) 2 % cream Apply topically.    [provider]  ketoconazole (NIZORAL) 2 % shampoo Shampoo scalp 2-3 times a week 10/19/19   Deirdre Evener, MD  ketoconazole (NIZORAL) 2 % shampoo Apply 1 application topically 3 (three) times a week. Wash face and post neck/scalp 01/18/20   Deirdre Evener, MD  ketorolac (TORADOL) 10 MG tablet Take 1 tablet (10  mg total) by mouth every 6 (six) hours as needed. 04/09/20   Triplett, Rulon Eisenmenger B, FNP  methylphenidate 36 MG PO CR tablet Take 36 mg by mouth daily.    [provider]  metoCLOPramide (REGLAN) 10 MG tablet Take 1 tablet (10 mg total) by mouth 3 (three) times daily with meals. 04/09/20 04/09/21  Triplett, Rulon Eisenmenger B, FNP  ondansetron (ZOFRAN ODT) 4 MG disintegrating tablet Take 1 tablet (4 mg total) by mouth every 8 (eight) hours as needed for nausea or vomiting. 04/07/20   Minna Antis, MD  pantoprazole (PROTONIX) 20 MG tablet Take 1 tablet (20 mg total) by mouth daily. 04/07/20 04/07/21  Minna Antis, MD  tretinoin (RETIN-A) 0.1 % cream Apply topically at bedtime. 10/07/19 10/06/20  Deirdre Evener, MD    Allergies Patient has no known allergies.  History reviewed. No pertinent family history.  Social History Social History   Tobacco Use  . Smoking status: Never Smoker  . Smokeless tobacco: Never Used  Substance Use Topics  . Alcohol use: Never  . Drug use: Never     Review of Systems  Constitutional: No fever/chills Eyes: No visual changes. No discharge. ENT: Positive for congestion and rhinorrhea. Positive for sore throat. Cardiovascular: No chest pain. Respiratory: Positive for cough. No SOB. Gastrointestinal: No abdominal pain.  No nausea, no vomiting.  No diarrhea.  No constipation. Musculoskeletal: Negative for musculoskeletal pain. Skin: Negative for rash, abrasions, lacerations, ecchymosis. Neurological: Negative for headaches.   ____________________________________________   PHYSICAL EXAM:  VITAL SIGNS: ED Triage Vitals  Enc Vitals Group     BP 06/01/20 0934 (!) 165/95     Pulse Rate 06/01/20 0934 84     Resp 06/01/20 0934 20     Temp 06/01/20 0934 99.1 F (37.3 C)     Temp Source 06/01/20 0934 Oral     SpO2 06/01/20 0934 95 %     Weight --      Height --      Head Circumference --      Peak Flow --      Pain Score 06/01/20 0935 5     Pain Loc  --      Pain Edu? --      Excl. in GC? --      Constitutional: Alert and oriented. Well appearing and in no acute distress. Eyes: Conjunctivae are normal. PERRL. EOMI. No discharge. Head: Atraumatic. ENT: No frontal and maxillary sinus tenderness.      Ears: Tympanic membranes pearly gray with good landmarks. No discharge.      Nose: Mild congestion/rhinnorhea.      Mouth/Throat: Mucous membranes are moist. Oropharynx non-erythematous. Tonsils not enlarged. No exudates. Uvula midline. Neck: No stridor.   Hematological/Lymphatic/Immunilogical: No cervical lymphadenopathy. Cardiovascular: Normal rate, regular rhythm.  Good peripheral circulation. Respiratory: Normal respiratory effort without tachypnea or retractions. Lungs CTAB. Good air entry to the bases with no decreased or absent breath sounds. Gastrointestinal: Bowel sounds 4 quadrants. Soft and nontender to palpation. No guarding or rigidity. No palpable masses. No distention. Musculoskeletal: Full range of motion to all extremities. No gross deformities appreciated. Neurologic:  Normal speech and language. No gross focal neurologic deficits are appreciated.  Skin:  Skin is warm, dry and intact. No rash noted. Psychiatric: Mood and affect are normal. Speech and behavior are normal. Patient exhibits appropriate insight and judgement.   ____________________________________________   LABS (all labs ordered are listed, but only abnormal results are displayed)  Labs Reviewed  RESP PANEL BY RT-PCR (FLU A&B, COVID) ARPGX2  GROUP A STREP BY PCR   ____________________________________________  EKG   ____________________________________________  RADIOLOGY   No results found.  ____________________________________________    PROCEDURES  Procedure(s) performed:    Procedures    Medications - No data to display   ____________________________________________   INITIAL IMPRESSION / ASSESSMENT AND PLAN / ED  COURSE  Pertinent labs & imaging results that were available during my care of the patient were reviewed by me and considered in my medical decision making (see chart for details).  Review of the [place] CSRS was performed in accordance of the [facility] prior to dispensing any controlled drugs.   Patient's diagnosis is consistent with viral URI. Vital signs and exam are reassuring.  Covid, influenza, strep are negative.  Patient appears well and is staying well hydrated. Patient feels comfortable going home. Patient will be discharged home with prescriptions for Flonase and Tessalon Perles. Patient is to follow up with primary care as needed or otherwise directed. Patient is given ED precautions to return to the ED for any worsening or new symptoms.   PATRIC BUCKHALTER was evaluated in Emergency Department on 06/01/2020 for the symptoms described in the history of present illness. He was evaluated in the context of the global COVID-19 pandemic, which necessitated consideration that the patient might be at risk for infection with the SARS-CoV-2 virus that causes COVID-19. Institutional protocols and algorithms that pertain to the evaluation of patients at risk for COVID-19 are in a state of rapid change based on information  released by regulatory bodies including the CDC and federal and state organizations. These policies and algorithms were followed during the patient's care in the ED.  ____________________________________________  FINAL CLINICAL IMPRESSION(S) / ED DIAGNOSES  Final diagnoses:  Viral URI with cough      NEW MEDICATIONS STARTED DURING THIS VISIT:  ED Discharge Orders         Ordered    benzonatate (TESSALON PERLES) 100 MG capsule  3 times daily PRN        06/01/20 1126    fluticasone (FLONASE) 50 MCG/ACT nasal spray  Daily        06/01/20 1126              This chart was dictated using voice recognition software/Dragon. Despite best efforts to proofread, errors can occur  which can change the meaning. Any change was purely unintentional.    Enid Derry, PA-C 06/01/20 1306    Willy Eddy, MD 06/01/20 [phone]

## 2020-06-01 NOTE — ED Triage Notes (Signed)
Pt presents via POV c/o sore throat and runny nose x2 days.
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
## 2020-07-29 ENCOUNTER — Other Ambulatory Visit: Payer: Self-pay

## 2020-07-29 ENCOUNTER — Emergency Department
Admission: EM | Admit: 2020-07-29 | Discharge: 2020-07-30 | Disposition: A | Discharge status: Home / Self Care | Payer: Medicaid Other | Attending: Emergency Medicine | Admitting: Emergency Medicine

## 2020-07-29 DIAGNOSIS — F840 Autistic disorder: Secondary | ICD-10-CM | POA: Diagnosis not present

## 2020-07-29 DIAGNOSIS — M79605 Pain in left leg: Secondary | ICD-10-CM | POA: Insufficient documentation

## 2020-07-29 DIAGNOSIS — X12XXXA Contact with other hot fluids, initial encounter: Secondary | ICD-10-CM | POA: Insufficient documentation

## 2020-07-29 DIAGNOSIS — R110 Nausea: Secondary | ICD-10-CM | POA: Insufficient documentation

## 2020-07-29 DIAGNOSIS — S0591XA Unspecified injury of right eye and orbit, initial encounter: Secondary | ICD-10-CM | POA: Diagnosis not present

## 2020-07-29 DIAGNOSIS — R1084 Generalized abdominal pain: Secondary | ICD-10-CM | POA: Insufficient documentation

## 2020-07-29 DIAGNOSIS — R197 Diarrhea, unspecified: Secondary | ICD-10-CM | POA: Diagnosis not present

## 2020-07-29 DIAGNOSIS — Y990 Civilian activity done for income or pay: Secondary | ICD-10-CM | POA: Diagnosis not present

## 2020-07-29 MED ORDER — TETRACAINE HCL 0.5 % OP SOLN
2.0000 [drp] | Freq: Once | OPHTHALMIC | Status: AC
  Administered 2020-07-30: 2 [drp] via OPHTHALMIC
  Filled 2020-07-29: qty 4

## 2020-07-29 MED ORDER — FLUORESCEIN SODIUM 1 MG OP STRP
1.0000 | ORAL_STRIP | Freq: Once | OPHTHALMIC | Status: AC
  Administered 2020-07-30: 1 via OPHTHALMIC
  Filled 2020-07-29: qty 1

## 2020-07-29 NOTE — ED Triage Notes (Signed)
Pt complains of generalized abd pain for over a week, left leg pain for "awhile" and right eye pain since 1400 today. Pt appears in no acute distress. Pt denies fever, states has had nausea without vomiting.

## 2020-07-29 NOTE — ED Notes (Signed)
Pt still refusing blood work at this time. MD aware.

## 2020-07-29 NOTE — ED Notes (Signed)
Pt refuses blood work at this time

## 2020-07-30 ENCOUNTER — Emergency Department: Payer: Medicaid Other

## 2020-07-30 LAB — COMPREHENSIVE METABOLIC PANEL
ALT: 32 U/L (ref 0–44)
AST: 28 U/L (ref 15–41)
Albumin: 4.7 g/dL (ref 3.5–5.0)
Alkaline Phosphatase: 100 U/L (ref 38–126)
Anion gap: 9 (ref 5–15)
BUN: 11 mg/dL (ref 6–20)
CO2: 26 mmol/L (ref 22–32)
Calcium: 9.7 mg/dL (ref 8.9–10.3)
Chloride: 104 mmol/L (ref 98–111)
Creatinine, Ser: 0.98 mg/dL (ref 0.61–1.24)
GFR, Estimated: >60 mL/min (ref >60)
Glucose, Bld: 95 mg/dL (ref 70–99)
Potassium: 3.9 mmol/L (ref 3.5–5.1)
Sodium: 139 mmol/L (ref 135–145)
Total Bilirubin: 0.7 mg/dL (ref 0.3–1.2)
Total Protein: 8.8 g/dL — ABNORMAL HIGH (ref 6.5–8.1)

## 2020-07-30 LAB — CBC
HCT: 43 % (ref 39.0–52.0)
Hemoglobin: 14.6 g/dL (ref 13.0–17.0)
MCH: 25.7 pg — ABNORMAL LOW (ref 26.0–34.0)
MCHC: 34 g/dL (ref 30.0–36.0)
MCV: 75.6 fL — ABNORMAL LOW (ref 80.0–100.0)
Platelets: 290 10*3/uL (ref 150–400)
RBC: 5.69 MIL/uL (ref 4.22–5.81)
RDW: 14.9 % (ref 11.5–15.5)
WBC: 7.7 10*3/uL (ref 4.0–10.5)
nRBC: 0 % (ref 0.0–0.2)

## 2020-07-30 LAB — URINALYSIS, COMPLETE (UACMP) WITH MICROSCOPIC
Bacteria, UA: NONE SEEN
Bilirubin Urine: NEGATIVE
Glucose, UA: NEGATIVE mg/dL
Ketones, ur: NEGATIVE mg/dL
Leukocytes,Ua: NEGATIVE
Nitrite: NEGATIVE
Protein, ur: 30 mg/dL — AB
Specific Gravity, Urine: 1.016 (ref 1.005–1.030)
Squamous Epithelial / HPF: NONE SEEN (ref 0–5)
pH: 6 (ref 5.0–8.0)

## 2020-07-30 LAB — LIPASE, BLOOD: Lipase: 23 U/L (ref 11–51)

## 2020-07-30 NOTE — ED Notes (Signed)
Eye kit at bedside. MD aware.

## 2020-07-30 NOTE — Discharge Instructions (Addendum)
Your abdominal exam today was benign and your abdominal labs and urine were normal.  If you feel that you are having symptoms after eating dairy, I recommend that you avoid eating dairy products.  Please follow-up with your primary care doctor if symptoms continue.  You may use over-the-counter Imodium as needed for diarrhea.  Your eye exam was also normal today.  No sign of any burns.  You may follow-up with your primary care doctor as needed.  I suspect your symptoms will continue to improve with time.  Your left leg showed no sign of blood clot today.  If you continue to have pain in this leg, please follow-up with your primary care provider.

## 2020-07-30 NOTE — ED Provider Notes (Signed)
[facility] Emergency Department Provider Note  ____________________________________________   Event Date/Time   First MD Initiated Contact with Patient 07/29/20 2332     (approximate)  I have reviewed the triage vital signs and the nursing notes.   HISTORY  Chief Complaint Abdominal Pain    HPI Jack Wolf is a 25 y.o. male with history of autism, ADHD who presents to the emergency department with multiple complaints.  Patient reports generalized crampy abdominal pain worse after eating dairy for the past 2 weeks.  He has had associated nausea and diarrhea but no vomiting.  No fever.  No previous history of abdominal surgery.  States pain is mostly around the umbilicus.  Patient also states that he was at work today and hot grease splashed into his right eye.  Denies any other burns.  States he is having eye pain and blurry vision.  No vision loss.  Does not wear contacts or glasses.  Patient also complaining of left leg pain that he states has been present for 3 days.  No known injury but feels that the leg is swollen and feels tight.  He states he feels like he has a hard time bending the leg.  No history of PE or DVT.  He denies chest pain or shortness of breath.  No numbness or weakness.  He is able to ambulate.        Past Medical History:  Diagnosis Date   ADHD    Autism     Patient Active Problem List   Diagnosis Date Noted   Mood disorder (HCC) 06/25/2018    No past surgical history on file.  Prior to Admission medications   Medication Sig Start Date End Date Taking? Authorizing Provider  benzonatate (TESSALON PERLES) 100 MG capsule Take 1 capsule (100 mg total) by mouth 3 (three) times daily as needed. 06/01/20 06/01/21  Enid Derry, PA-C  brompheniramine-pseudoephedrine-DM 30-2-10 MG/5ML syrup Take 5 mLs by mouth 4 (four) times daily as needed. 12/05/19   Tommi Rumps, PA-C  cetirizine (ZYRTEC) 10 MG tablet Take by  mouth.    [provider]  fluticasone (FLONASE) 50 MCG/ACT nasal spray Place 2 sprays into both nostrils daily. 06/01/20 06/01/21  Enid Derry, PA-C  ketoconazole (NIZORAL) 2 % cream Apply topically.    [provider]  ketoconazole (NIZORAL) 2 % shampoo Shampoo scalp 2-3 times a week 10/19/19   Deirdre Evener, MD  ketoconazole (NIZORAL) 2 % shampoo Apply 1 application topically 3 (three) times a week. Wash face and post neck/scalp 01/18/20   Deirdre Evener, MD  ketorolac (TORADOL) 10 MG tablet Take 1 tablet (10 mg total) by mouth every 6 (six) hours as needed. 04/09/20   Triplett, Rulon Eisenmenger B, FNP  methylphenidate 36 MG PO CR tablet Take 36 mg by mouth daily.    [provider]  metoCLOPramide (REGLAN) 10 MG tablet Take 1 tablet (10 mg total) by mouth 3 (three) times daily with meals. 04/09/20 04/09/21  Triplett, Rulon Eisenmenger B, FNP  ondansetron (ZOFRAN ODT) 4 MG disintegrating tablet Take 1 tablet (4 mg total) by mouth every 8 (eight) hours as needed for nausea or vomiting. 04/07/20   Minna Antis, MD  pantoprazole (PROTONIX) 20 MG tablet Take 1 tablet (20 mg total) by mouth daily. 04/07/20 04/07/21  Minna Antis, MD  tretinoin (RETIN-A) 0.1 % cream Apply topically at bedtime. 10/07/19 10/06/20  Deirdre Evener, MD    Allergies Patient has no known allergies.  No family history on file.  Social History Social History   Tobacco Use   Smoking status: Never Smoker   Smokeless tobacco: Never Used  Substance Use Topics   Alcohol use: Never   Drug use: Never    Review of Systems Constitutional: No fever. Eyes: No visual changes. ENT: No sore throat. Cardiovascular: Denies chest pain. Respiratory: Denies shortness of breath. Gastrointestinal: No nausea, vomiting, diarrhea. Genitourinary: Negative for dysuria. Musculoskeletal: Negative for back pain. Skin: Negative for rash. Neurological: Negative for focal weakness or  numbness.  ____________________________________________   PHYSICAL EXAM:  VITAL SIGNS: ED Triage Vitals  Enc Vitals Group     BP 07/29/20 2136 (!) 163/92     Pulse Rate 07/29/20 2136 78     Resp 07/29/20 2136 16     Temp --      Temp Source 07/29/20 2136 Oral     SpO2 07/29/20 2136 100 %     Weight 07/29/20 2137 (!) 325 lb (147.4 kg)     Height 07/29/20 2137 5\' 11"  (1.803 m)     Head Circumference --      Peak Flow --      Pain Score 07/29/20 2136 10     Pain Loc --      Pain Edu? --      Excl. in GC? --    CONSTITUTIONAL: Alert and oriented and responds appropriately to questions. Well-appearing; well-nourished HEAD: Normocephalic EYES: Conjunctivae clear, pupils appear equal, EOM appear intact, no fluorescein uptake noted to the right eye, no appreciated foreign body or corneal ulceration, intraocular pressure is 17 mmHg,  Bilateral Distance: 20/20 R Distance: 20/25 L Distance: 20/15 ENT: normal nose; moist mucous membranes NECK: Supple, normal ROM CARD: RRR; S1 and S2 appreciated; no murmurs, no clicks, no rubs, no gallops RESP: Normal chest excursion without splinting or tachypnea; breath sounds clear and equal bilaterally; no wheezes, no rhonchi, no rales, no hypoxia or respiratory distress, speaking full sentences ABD/GI: Normal bowel sounds; non-distended; soft, non-tender, no rebound, no guarding, no peritoneal signs, no hepatosplenomegaly BACK: The back appears normal EXT: Normal ROM in all joints; no deformity noted, no edema; no cyanosis, 2+ DP pulses bilaterally, patient reports his entire left leg is uncomfortable but there is no specific tenderness on exam.  There is no redness, warmth, ecchymosis, soft tissue swelling, calf tenderness.  No joint effusions.  Compartments in the left leg are soft. SKIN: Normal color for age and race; warm; no rash on exposed skin NEURO: Moves all extremities equally PSYCH: The patient's mood and manner are  appropriate.  ____________________________________________   LABS (all labs ordered are listed, but only abnormal results are displayed)  Labs Reviewed  COMPREHENSIVE METABOLIC PANEL - Abnormal; Notable for the following components:      Result Value   Total Protein 8.8 (*)    All other components within normal limits  CBC - Abnormal; Notable for the following components:   MCV 75.6 (*)    MCH 25.7 (*)    All other components within normal limits  URINALYSIS, COMPLETE (UACMP) WITH MICROSCOPIC - Abnormal; Notable for the following components:   Color, Urine YELLOW (*)    APPearance CLEAR (*)    Hgb urine dipstick SMALL (*)    Protein, ur 30 (*)    All other components within normal limits  LIPASE, BLOOD   ____________________________________________  EKG  None ____________________________________________  RADIOLOGY I, Renie Stelmach, personally viewed and evaluated these images (plain radiographs) as part of  my medical decision making, as well as reviewing the written report by the radiologist.  ED MD interpretation: No DVT.  Official radiology report(s): US Venous Img Lower Unilateral Left  Result Date: 07/30/2020 CLINICAL DATA:  Pain and swelling EXAM: Left LOWER EXTREMITY VENOUS DOPPLER ULTRASOUND TECHNIQUE: Gray-scale sonography with compression, as well as color and duplex ultrasound, were performed to evaluate the deep venous system(s) from the level of the common femoral vein through the popliteal and proximal calf veins. COMPARISON:  None. FINDINGS: VENOUS Normal compressibility of the common femoral, superficial femoral, and popliteal veins, as well as the visualized calf veins. Visualized portions of profunda femoral vein and great saphenous vein unremarkable. No filling defects to suggest DVT on grayscale or color Doppler imaging. Doppler waveforms show normal direction of venous flow, normal respiratory plasticity and response to augmentation. Limited views of the  contralateral common femoral vein are unremarkable. OTHER None. Limitations: none IMPRESSION: Negative. Electronically Signed   By: Jonna Clark M.D.   On: 07/30/2020 02:05    ____________________________________________   PROCEDURES  Procedure(s) performed (including Critical Care):  Procedures    ____________________________________________   INITIAL IMPRESSION / ASSESSMENT AND PLAN / ED COURSE  As part of my medical decision making, I reviewed the following data within the electronic MEDICAL RECORD NUMBER Nursing notes reviewed and incorporated, Labs reviewed  Old chart reviewed and Notes from prior ED visits         Patient here with multiple different complaints.  He is complaining of generalized abdominal pain with nausea and diarrhea for 2 weeks.  His abdominal exam is benign.  Labs, urine ordered from triage pending.  Low suspicion for colitis, diverticulitis, appendicitis, UTI, pyelonephritis, kidney stone, cholecystitis, pancreatitis given benign exam.  He denies wanting anything for pain or nausea at this time.  He states he thinks he is lactose intolerant.  Patient also complaining of getting grease in his right eye tonight.  Will perform eye exam to evaluate for corneal ulceration, abrasion.  No signs of foreign body.  Will check visual acuity.  No signs of any burns to his skin, face.  Patient also complaining of left leg pain for the past 3 days.  No injury.  Nothing at this time to suggest fracture, compartment syndrome, septic arthritis, gout, arterial obstruction.  Will obtain venous Doppler to rule out DVT.  He is able to ambulate.  He denies having any chest pain or shortness of breath.  ED PROGRESS  Patient's labs reassuring.  Normal LFTs, lipase, creatinine, urine.  No leukocytosis.  Abdominal exam again benign.  Tolerating p.o. here.  Recommended he avoid dairy products if he feels that this is causing him to become symptomatic.  Patient's eye exam is  unremarkable.  Normal intraocular pressure.  No corneal abrasions, ulceration.  Visual acuity within normal limits.  No sign of any burns to the face, eye.  I feel he is safe for outpatient follow-up as needed.  Patient's venous Doppler shows no DVT.  He is able to ambulate here.  Discussed with patient that I do not feel he needs further emergent work-up.  I feel he is safe for discharge home.  He has a PCP for outpatient follow-up.  At this time, I do not feel there is any life-threatening condition present. I have reviewed, interpreted and discussed all results (EKG, imaging, lab, urine as appropriate) and exam findings with patient/family. I have reviewed nursing notes and appropriate previous records.  I feel the patient is safe to  be discharged home without further emergent workup and can continue workup as an outpatient as needed. Discussed usual and customary return precautions. Patient/family verbalize understanding and are comfortable with this plan.  Outpatient follow-up has been provided as needed. All questions have been answered.  ____________________________________________   FINAL CLINICAL IMPRESSION(S) / ED DIAGNOSES  Final diagnoses:  Generalized abdominal pain  Right eye injury, initial encounter  Left leg pain     ED Discharge Orders    None      *Please note:  Deondrea D Aundria RudRogers was evaluated in Emergency Department on 07/30/2020 for the symptoms described in the history of present illness. He was evaluated in the context of the global COVID-19 pandemic, which necessitated consideration that the patient might be at risk for infection with the SARS-CoV-2 virus that causes COVID-19. Institutional protocols and algorithms that pertain to the evaluation of patients at risk for COVID-19 are in a state of rapid change based on information released by regulatory bodies including the CDC and federal and state organizations. These policies and algorithms were followed during the  patient's care in the ED.  Some ED evaluations and interventions may be delayed as a result of limited staffing during and the pandemic.*   Note:  This document was prepared using Dragon voice recognition software and may include unintentional dictation errors.   Dasan Hardman, Layla MawKristen N, DO 07/30/20 [phone]

## 2020-08-13 ENCOUNTER — Other Ambulatory Visit: Payer: Self-pay

## 2020-08-13 ENCOUNTER — Emergency Department
Admission: EM | Admit: 2020-08-13 | Discharge: 2020-08-13 | Disposition: A | Discharge status: Home / Self Care | Payer: Medicaid Other | Attending: Emergency Medicine | Admitting: Emergency Medicine

## 2020-08-13 DIAGNOSIS — F840 Autistic disorder: Secondary | ICD-10-CM | POA: Insufficient documentation

## 2020-08-13 DIAGNOSIS — J0100 Acute maxillary sinusitis, unspecified: Secondary | ICD-10-CM | POA: Insufficient documentation

## 2020-08-13 DIAGNOSIS — M791 Myalgia, unspecified site: Secondary | ICD-10-CM | POA: Diagnosis not present

## 2020-08-13 DIAGNOSIS — Z20822 Contact with and (suspected) exposure to covid-19: Secondary | ICD-10-CM | POA: Diagnosis not present

## 2020-08-13 DIAGNOSIS — R0989 Other specified symptoms and signs involving the circulatory and respiratory systems: Secondary | ICD-10-CM | POA: Diagnosis present

## 2020-08-13 LAB — SARS CORONAVIRUS 2 (TAT 6-24 HRS): SARS Coronavirus 2: NEGATIVE

## 2020-08-13 LAB — GROUP A STREP BY PCR: Group A Strep by PCR: NOT DETECTED

## 2020-08-13 MED ORDER — AMOXICILLIN 875 MG PO TABS: 875.0000 mg | ORAL_TABLET | Freq: Two times a day (BID) | ORAL | 0 refills | Status: DC

## 2020-08-13 MED ORDER — FEXOFENADINE-PSEUDOEPHED ER 60-120 MG PO TB12: 1.0000 | ORAL_TABLET | Freq: Two times a day (BID) | ORAL | 0 refills | Status: DC

## 2020-08-13 NOTE — ED Notes (Signed)
See triage note   Presents with sore throat h/a and runny nose  States sx's started 4 days ago   Afebrile on arrival  Subjective fever at home

## 2020-08-13 NOTE — ED Triage Notes (Addendum)
Pt c/o sore throat with runny nose , cough, body aches for the past 4 days

## 2020-08-13 NOTE — Discharge Instructions (Addendum)
Your Covid test will be available later today.  You may use the MyChart app to review the results.  Follow discharge care instruction take medication as directed.

## 2020-08-13 NOTE — ED Provider Notes (Signed)
[facility] Emergency Department Provider Note   ____________________________________________   Event Date/Time   First MD Initiated Contact with Patient 08/13/20 1301     (approximate)  I have reviewed the triage vital signs and the nursing notes.   HISTORY  Chief Complaint URI    HPI Jack Wolf is a 25 y.o. male patient presents with 4 days of sore throat, runny nose, cough, and body aches.  Patient denies recent travel or known contact with COVID-19.  Patient took COVID-19 vaccine in July 2021.  Patient is not taking a booster.  Patient is not taking the influenza shot for the season. .     Past Medical History:  Diagnosis Date  . ADHD   . Autism     Patient Active Problem List   Diagnosis Date Noted  . Mood disorder (HCC) 06/25/2018    History reviewed. No pertinent surgical history.  Prior to Admission medications   Medication Sig Start Date End Date Taking? Authorizing Provider  amoxicillin (AMOXIL) 875 MG tablet Take 1 tablet (875 mg total) by mouth 2 (two) times daily. 08/13/20  Yes Joni Reining, PA-C  fexofenadine-pseudoephedrine (ALLEGRA-D) 60-120 MG 12 hr tablet Take 1 tablet by mouth 2 (two) times daily. 08/13/20  Yes Joni Reining, PA-C  benzonatate (TESSALON PERLES) 100 MG capsule Take 1 capsule (100 mg total) by mouth 3 (three) times daily as needed. 06/01/20 06/01/21  Enid Derry, PA-C  cetirizine (ZYRTEC) 10 MG tablet Take by mouth.    [provider]  fluticasone (FLONASE) 50 MCG/ACT nasal spray Place 2 sprays into both nostrils daily. 06/01/20 06/01/21  Enid Derry, PA-C  ketoconazole (NIZORAL) 2 % cream Apply topically.    [provider]  ketoconazole (NIZORAL) 2 % shampoo Shampoo scalp 2-3 times a week 10/19/19   Deirdre Evener, MD  ketoconazole (NIZORAL) 2 % shampoo Apply 1 application topically 3 (three) times a week. Wash face and post neck/scalp 01/18/20   Deirdre Evener, MD   methylphenidate 36 MG PO CR tablet Take 36 mg by mouth daily.    [provider]  metoCLOPramide (REGLAN) 10 MG tablet Take 1 tablet (10 mg total) by mouth 3 (three) times daily with meals. 04/09/20 04/09/21  Triplett, Rulon Eisenmenger B, FNP  ondansetron (ZOFRAN ODT) 4 MG disintegrating tablet Take 1 tablet (4 mg total) by mouth every 8 (eight) hours as needed for nausea or vomiting. 04/07/20   Minna Antis, MD  pantoprazole (PROTONIX) 20 MG tablet Take 1 tablet (20 mg total) by mouth daily. 04/07/20 04/07/21  Minna Antis, MD  tretinoin (RETIN-A) 0.1 % cream Apply topically at bedtime. 10/07/19 10/06/20  Deirdre Evener, MD    Allergies Patient has no known allergies.  No family history on file.  Social History Social History   Tobacco Use  . Smoking status: Never Smoker  . Smokeless tobacco: Never Used  Substance Use Topics  . Alcohol use: Never  . Drug use: Never    Review of Systems Constitutional: No fever/chills Eyes: No visual changes. ENT: No sore throat. Cardiovascular: Denies chest pain. Respiratory: Denies shortness of breath. Gastrointestinal: No abdominal pain.  No nausea, no vomiting.  No diarrhea.  No constipation. Genitourinary: Negative for dysuria. Musculoskeletal: Negative for back pain. Skin: Negative for rash. Neurological: Negative for headaches, focal weakness or numbness. Psychiatric:  Mood disorder.  ____________________________________________   PHYSICAL EXAM:  VITAL SIGNS: ED Triage Vitals  Enc Vitals Group     BP 08/13/20  1229 136/77     Pulse Rate 08/13/20 1229 86     Resp 08/13/20 1229 18     Temp 08/13/20 1229 98.9 F (37.2 C)     Temp src --      SpO2 08/13/20 1229 100 %     Weight 08/13/20 1257 (!) 324 lb 15.3 oz (147.4 kg)     Height 08/13/20 1257 5\' 11"  (1.803 m)     Head Circumference --      Peak Flow --      Pain Score --      Pain Loc --      Pain Edu? --      Excl. in GC? --    Constitutional: Alert and oriented.  Well appearing and in no acute distress. Eyes: Conjunctivae are normal. PERRL. EOMI. Head: Atraumatic. Nose: Edematous nasal turbinates with copious thick rhinorrhea.   Mouth/Throat: Mucous membranes are moist.  Postnasal drainage.  Oropharynx non-erythematous. Neck: No stridor.  Hematological/Lymphatic/Immunilogical: No cervical lymphadenopathy. Cardiovascular: Normal rate, regular rhythm. Grossly normal heart sounds.  Good peripheral circulation. Respiratory: Normal respiratory effort.  No retractions. Lungs CTAB. Musculoskeletal: No lower extremity tenderness nor edema.  No joint effusions. Neurologic:  Normal speech and language. No gross focal neurologic deficits are appreciated. No gait instability. Skin:  Skin is warm, dry and intact. No rash noted. Psychiatric: Mood and affect are normal. Speech and behavior are normal.  ____________________________________________   LABS (all labs ordered are listed, but only abnormal results are displayed)  Labs Reviewed  GROUP A STREP BY PCR  GROUP A STREP BY PCR  SARS CORONAVIRUS 2 (TAT 6-24 HRS)   ____________________________________________  EKG   ____________________________________________  RADIOLOGY , personally viewed and evaluated these images (plain radiographs) as part of my medical decision making, as well as reviewing the written report by the radiologist.  ED MD interpretation:    Official radiology report(s): No results found.  ____________________________________________   PROCEDURES  Procedure(s) performed (including Critical Care):  Procedures   ____________________________________________   INITIAL IMPRESSION / ASSESSMENT AND PLAN / ED COURSE  As part of my medical decision making, I reviewed the following data within the electronic MEDICAL RECORD NUMBER         Patient presents with 4 days of sore throat, runny nose, cough, and body aches.  Patient rapid strep test was negative.   Patient COVID-19 test is pending.  Patient advised he may find results in the MyChart app.  Patient advised self quarantine pending results of COVID-19 test.  Patient complaining physical exam consistent with sinusitis.  Patient given discharge care instruction advised take medication as directed.  Patient advised follow-up PCP.     ____________________________________________   FINAL CLINICAL IMPRESSION(S) / ED DIAGNOSES  Final diagnoses:  Subacute maxillary sinusitis     ED Discharge Orders         Ordered    amoxicillin (AMOXIL) 875 MG tablet  2 times daily        08/13/20 1418    fexofenadine-pseudoephedrine (ALLEGRA-D) 60-120 MG 12 hr tablet  2 times daily        08/13/20 1418          *Please note:  Jack Wolf was evaluated in Emergency Department on 08/13/2020 for the symptoms described in the history of present illness. He was evaluated in the context of the global COVID-19 pandemic, which necessitated consideration that the patient might be at risk for infection with the SARS-CoV-2 virus that  causes COVID-19. Institutional protocols and algorithms that pertain to the evaluation of patients at risk for COVID-19 are in a state of rapid change based on information released by regulatory bodies including the CDC and federal and state organizations. These policies and algorithms were followed during the patient's care in the ED.  Some ED evaluations and interventions may be delayed as a result of limited staffing during and the pandemic.*   Note:  This document was prepared using Dragon voice recognition software and may include unintentional dictation errors.    Joni Reining, PA-C 08/13/20 1423    Minna Antis, MD 08/13/20 [phone]

## 2021-05-22 ENCOUNTER — Emergency Department
Admission: EM | Admit: 2021-05-22 | Discharge: 2021-05-22 | Disposition: A | Discharge status: Home / Self Care | Payer: Medicaid Other | Attending: Emergency Medicine | Admitting: Emergency Medicine

## 2021-05-22 ENCOUNTER — Other Ambulatory Visit: Payer: Self-pay

## 2021-05-22 DIAGNOSIS — A549 Gonococcal infection, unspecified: Secondary | ICD-10-CM | POA: Insufficient documentation

## 2021-05-22 DIAGNOSIS — J029 Acute pharyngitis, unspecified: Secondary | ICD-10-CM | POA: Diagnosis present

## 2021-05-22 DIAGNOSIS — Z20822 Contact with and (suspected) exposure to covid-19: Secondary | ICD-10-CM | POA: Insufficient documentation

## 2021-05-22 DIAGNOSIS — F840 Autistic disorder: Secondary | ICD-10-CM | POA: Diagnosis not present

## 2021-05-22 LAB — URINALYSIS, ROUTINE W REFLEX MICROSCOPIC
Bacteria, UA: NONE SEEN
Bilirubin Urine: NEGATIVE
Glucose, UA: NEGATIVE mg/dL
Ketones, ur: NEGATIVE mg/dL
Leukocytes,Ua: NEGATIVE
Nitrite: NEGATIVE
Protein, ur: 30 mg/dL — AB
Specific Gravity, Urine: 1.015 (ref 1.005–1.030)
Squamous Epithelial / HPF: NONE SEEN (ref 0–5)
pH: 8 (ref 5.0–8.0)

## 2021-05-22 LAB — RESP PANEL BY RT-PCR (FLU A&B, COVID) ARPGX2
Influenza A by PCR: NEGATIVE
Influenza B by PCR: NEGATIVE
SARS Coronavirus 2 by RT PCR: NEGATIVE

## 2021-05-22 LAB — CHLAMYDIA/NGC RT PCR (ARMC ONLY)
Chlamydia Tr: NOT DETECTED
N gonorrhoeae: DETECTED — AB

## 2021-05-22 LAB — GROUP A STREP BY PCR: Group A Strep by PCR: NOT DETECTED

## 2021-05-22 MED ORDER — LIDOCAINE HCL (PF) 1 % IJ SOLN
INTRAMUSCULAR | Status: AC
  Filled 2021-05-22: qty 5

## 2021-05-22 MED ORDER — CEFTRIAXONE SODIUM 1 G IJ SOLR
750.0000 mg | Freq: Once | INTRAMUSCULAR | Status: AC
  Administered 2021-05-22: 22:00:00 750 mg via INTRAMUSCULAR
  Filled 2021-05-22: qty 10

## 2021-05-22 MED ORDER — DOXYCYCLINE HYCLATE 100 MG PO TABS: 100.0000 mg | ORAL_TABLET | Freq: Two times a day (BID) | ORAL | 0 refills | Status: DC

## 2021-05-22 NOTE — ED Notes (Signed)
Spoke with Jack Wolf in lab--to cancel urine preg entered in error

## 2021-05-22 NOTE — ED Triage Notes (Signed)
Pt c/o runny nose and cough and is wanting to be checked for covid and "I need an STD check".

## 2021-05-22 NOTE — ED Provider Notes (Signed)
[facility] Emergency Department Provider Note  ____________________________________________  Time seen: Approximately 9:01 PM  I have reviewed the triage vital signs and the nursing notes.   HISTORY  Chief Complaint URI and Exposure to STD    HPI Jack Wolf is a 25 y.o. male who presents the emergency department for possible exposure to gonorrhea.  Patient was seen by myself in triage on arrival.  He had a sore throat with some exudates and has been exposed to gonorrhea.  Differential included COVID, flu, strep and patient would like to be tested for these but he is most concerned at this time that he may have contracted gonorrhea.  He has no urinary symptoms.  No GI complaints.  No nasal congestion, fevers, chills, cough.  No medications prior to arrival.       Past Medical History:  Diagnosis Date   ADHD    Autism     Patient Active Problem List   Diagnosis Date Noted   Mood disorder ([place]) 06/25/2018    History reviewed. No pertinent surgical history.  Prior to Admission medications   Medication Sig Start Date End Date Taking? Authorizing Provider  doxycycline (VIBRA-TABS) 100 MG tablet Take 1 tablet (100 mg total) by mouth 2 (two) times daily. 05/22/21  Yes Elliemae Braman, Charline Bills, PA-C  amoxicillin (AMOXIL) 875 MG tablet Take 1 tablet (875 mg total) by mouth 2 (two) times daily. 08/13/20   Sable Feil, PA-C  benzonatate (TESSALON PERLES) 100 MG capsule Take 1 capsule (100 mg total) by mouth 3 (three) times daily as needed. 06/01/20 06/01/21  Laban Emperor, PA-C  cetirizine (ZYRTEC) 10 MG tablet Take by mouth.    [provider]  fexofenadine-pseudoephedrine (ALLEGRA-D) 60-120 MG 12 hr tablet Take 1 tablet by mouth 2 (two) times daily. 08/13/20   Sable Feil, PA-C  fluticasone (FLONASE) 50 MCG/ACT nasal spray Place 2 sprays into both nostrils daily. 06/01/20 06/01/21  Laban Emperor, PA-C  ketoconazole (NIZORAL) 2 % cream Apply  topically.    [provider]  ketoconazole (NIZORAL) 2 % shampoo Shampoo scalp 2-3 times a week 10/19/19   Ralene Bathe, MD  ketoconazole (NIZORAL) 2 % shampoo Apply 1 application topically 3 (three) times a week. Wash face and post neck/scalp 01/18/20   Ralene Bathe, MD  methylphenidate 36 MG PO CR tablet Take 36 mg by mouth daily.    [provider]  metoCLOPramide (REGLAN) 10 MG tablet Take 1 tablet (10 mg total) by mouth 3 (three) times daily with meals. 04/09/20 04/09/21  Triplett, Johnette Abraham B, FNP  ondansetron (ZOFRAN ODT) 4 MG disintegrating tablet Take 1 tablet (4 mg total) by mouth every 8 (eight) hours as needed for nausea or vomiting. 04/07/20   Harvest Dark, MD  pantoprazole (PROTONIX) 20 MG tablet Take 1 tablet (20 mg total) by mouth daily. 04/07/20 04/07/21  Harvest Dark, MD    Allergies Patient has no known allergies.  No family history on file.  Social History Social History   Tobacco Use   Smoking status: Never   Smokeless tobacco: Never  Substance Use Topics   Alcohol use: Never   Drug use: Never     Review of Systems  Constitutional: No fever/chills Eyes: No visual changes. No discharge ENT: Positive for sore throat Cardiovascular: no chest pain. Respiratory: no cough. No SOB. Gastrointestinal: No abdominal pain.  No nausea, no vomiting.  No diarrhea.  No constipation. Genitourinary: Negative for dysuria. No hematuria Musculoskeletal: Negative for  musculoskeletal pain. Skin: Negative for rash, abrasions, lacerations, ecchymosis. Neurological: Negative for headaches, focal weakness or numbness.  10 System ROS otherwise negative.  ____________________________________________   PHYSICAL EXAM:  VITAL SIGNS: ED Triage Vitals  Enc Vitals Group     BP 05/22/21 1759 (!) 149/86     Pulse Rate 05/22/21 1759 84     Resp 05/22/21 1759 20     Temp 05/22/21 1759 99.1 F (37.3 C)     Temp Source 05/22/21 1759 Oral     SpO2  05/22/21 1759 94 %     Weight --      Height --      Head Circumference --      Peak Flow --      Pain Score 05/22/21 1800 4     Pain Loc --      Pain Edu? --      Excl. in GC? --      Constitutional: Alert and oriented. Well appearing and in no acute distress. Eyes: Conjunctivae are normal. PERRL. EOMI. Head: Atraumatic. ENT:      Ears:       Nose: No congestion/rhinnorhea.      Mouth/Throat: Mucous membranes are moist.  Neck: No stridor.   Hematological/Lymphatic/Immunilogical: No cervical lymphadenopathy. Cardiovascular: Normal rate, regular rhythm. Normal S1 and S2.  Good peripheral circulation. Respiratory: Normal respiratory effort without tachypnea or retractions. Lungs CTAB. Good air entry to the bases with no decreased or absent breath sounds. Musculoskeletal: Full range of motion to all extremities. No gross deformities appreciated. Neurologic:  Normal speech and language. No gross focal neurologic deficits are appreciated.  Skin:  Skin is warm, dry and intact. No rash noted. Psychiatric: Mood and affect are normal. Speech and behavior are normal. Patient exhibits appropriate insight and judgement.   ____________________________________________   LABS (all labs ordered are listed, but only abnormal results are displayed)  Labs Reviewed  CHLAMYDIA/NGC RT PCR ([facility] ONLY)           - Abnormal; Notable for the following components:      Result Value   N gonorrhoeae DETECTED (*)    All other components within normal limits  URINALYSIS, ROUTINE W REFLEX MICROSCOPIC - Abnormal; Notable for the following components:   APPearance CLEAR (*)    Hgb urine dipstick TRACE (*)    Protein, ur 30 (*)    All other components within normal limits  RESP PANEL BY RT-PCR (FLU A&B, COVID) ARPGX2  GROUP A STREP BY PCR   ____________________________________________  EKG   ____________________________________________  RADIOLOGY   No results  found.  ____________________________________________    PROCEDURES  Procedure(s) performed:    Procedures    Medications  cefTRIAXone (ROCEPHIN) injection 750 mg (has no administration in time range)     ____________________________________________   INITIAL IMPRESSION / ASSESSMENT AND PLAN / ED COURSE  Pertinent labs & imaging results that were available during my care of the patient were reviewed by me and considered in my medical decision making (see chart for details).  Review of the [place] CSRS was performed in accordance of the [facility] prior to dispensing any controlled drugs.           Patient's diagnosis is consistent with gonorrhea.  Patient presented to the emergency department for sore throat with exposure to gonorrhea.  Findings on exam are concerning for gonorrhea versus strep.  Strep was negative and patient was positive for gonorrhea.  At this time he will be treated with Rocephin  and doxycycline.  No other concerning physical exam findings and remainder of swabs were negative.  Follow-up with primary care as needed.  Return cautions discussed with the patient..  Patient is given ED precautions to return to the ED for any worsening or new symptoms.     ____________________________________________  FINAL CLINICAL IMPRESSION(S) / ED DIAGNOSES  Final diagnoses:  Gonorrhea      NEW MEDICATIONS STARTED DURING THIS VISIT:  ED Discharge Orders          Ordered    doxycycline (VIBRA-TABS) 100 MG tablet  2 times daily        05/22/21 2113                This chart was dictated using voice recognition software/Dragon. Despite best efforts to proofread, errors can occur which can change the meaning. Any change was purely unintentional.    Darletta Moll, PA-C 05/22/21 2113    Harvest Dark, MD 05/22/21 2259

## 2021-05-22 NOTE — ED Provider Notes (Signed)
Emergency Medicine Provider Triage Evaluation Note  Jack Wolf , a 25 y.o. male  was evaluated in triage.  Pt complains of sore throat, possible exposure to gonorrhea.  Patient states that he is concerned as he has had some white spots in the throat, sore throat after his last sexual encounter.  He states that this individual informed him that they were gonorrhea positive.  Patient is here for testing.  He has no urinary symptoms but is complaining of sore throat is concerned that he may have gonorrhea in his throat..  Review of Systems  Positive: Sore throat Negative: Fevers, chills, nasal congestion, cough, urinary symptoms, abdominal pain  Physical Exam  BP (!) 149/86 (BP Location: Right Arm)    Pulse 84    Temp 99.1 F (37.3 C) (Oral)    Resp 20    SpO2 94%  Gen:   Awake, no distress   Resp:  Normal effort  MSK:   Moves extremities without difficulty  Other:  Oropharynx with a few exudative spots in the posterior oropharynx.  Uvula is midline  Medical Decision Making  Medically screening exam initiated at 6:03 PM.  Appropriate orders placed.  AMIEL MCCAFFREY was informed that the remainder of the evaluation will be completed by another provider, this initial triage assessment does not replace that evaluation, and the importance of remaining in the ED until their evaluation is complete.  Patient arrived for testing for gonorrhea possible gonococcal infection in the throat.  Patient states that he would like gonorrhea testing.  I will test for COVID, flu, strep and gonorrhea.  I have done both the gonorrhea swab of the throat as well as urine   Racheal Patches, PA-C 05/22/21 1807    Minna Antis, MD 05/22/21 2259

## 2021-05-31 ENCOUNTER — Ambulatory Visit: Payer: Medicaid Other

## 2021-06-25 ENCOUNTER — Encounter: Payer: Self-pay | Admitting: Emergency Medicine

## 2021-06-25 ENCOUNTER — Emergency Department
Admission: EM | Admit: 2021-06-25 | Discharge: 2021-06-25 | Disposition: A | Discharge status: Home / Self Care | Payer: No Typology Code available for payment source | Attending: Emergency Medicine | Admitting: Emergency Medicine

## 2021-06-25 ENCOUNTER — Other Ambulatory Visit: Payer: Self-pay

## 2021-06-25 DIAGNOSIS — F4323 Adjustment disorder with mixed anxiety and depressed mood: Secondary | ICD-10-CM | POA: Diagnosis not present

## 2021-06-25 DIAGNOSIS — F32A Depression, unspecified: Secondary | ICD-10-CM

## 2021-06-25 DIAGNOSIS — F419 Anxiety disorder, unspecified: Secondary | ICD-10-CM

## 2021-06-25 LAB — CBC
HCT: 40.4 % (ref 39.0–52.0)
Hemoglobin: 13.9 g/dL (ref 13.0–17.0)
MCH: 26.3 pg (ref 26.0–34.0)
MCHC: 34.4 g/dL (ref 30.0–36.0)
MCV: 76.4 fL — ABNORMAL LOW (ref 80.0–100.0)
Platelets: 247 10*3/uL (ref 150–400)
RBC: 5.29 MIL/uL (ref 4.22–5.81)
RDW: 13.8 % (ref 11.5–15.5)
WBC: 4.6 10*3/uL (ref 4.0–10.5)
nRBC: 0 % (ref 0.0–0.2)

## 2021-06-25 LAB — COMPREHENSIVE METABOLIC PANEL
ALT: 31 U/L (ref 0–44)
AST: 22 U/L (ref 15–41)
Albumin: 4.2 g/dL (ref 3.5–5.0)
Alkaline Phosphatase: 101 U/L (ref 38–126)
Anion gap: 8 (ref 5–15)
BUN: 9 mg/dL (ref 6–20)
CO2: 25 mmol/L (ref 22–32)
Calcium: 9.5 mg/dL (ref 8.9–10.3)
Chloride: 102 mmol/L (ref 98–111)
Creatinine, Ser: 0.89 mg/dL (ref 0.61–1.24)
GFR, Estimated: >60 mL/min (ref >60)
Glucose, Bld: 119 mg/dL — ABNORMAL HIGH (ref 70–99)
Potassium: 3.4 mmol/L — ABNORMAL LOW (ref 3.5–5.1)
Sodium: 135 mmol/L (ref 135–145)
Total Bilirubin: 0.6 mg/dL (ref 0.3–1.2)
Total Protein: 8.1 g/dL (ref 6.5–8.1)

## 2021-06-25 LAB — URINE DRUG SCREEN, QUALITATIVE (ARMC ONLY)
Amphetamines, Ur Screen: NOT DETECTED
Barbiturates, Ur Screen: NOT DETECTED
Benzodiazepine, Ur Scrn: NOT DETECTED
Cannabinoid 50 Ng, Ur ~~LOC~~: NOT DETECTED
Cocaine Metabolite,Ur ~~LOC~~: NOT DETECTED
MDMA (Ecstasy)Ur Screen: NOT DETECTED
Methadone Scn, Ur: NOT DETECTED
Opiate, Ur Screen: NOT DETECTED
Phencyclidine (PCP) Ur S: NOT DETECTED
Tricyclic, Ur Screen: NOT DETECTED

## 2021-06-25 LAB — ACETAMINOPHEN LEVEL: Acetaminophen (Tylenol), Serum: <10 ug/mL — ABNORMAL LOW (ref 10–30)

## 2021-06-25 LAB — CHLAMYDIA/NGC RT PCR (ARMC ONLY)
Chlamydia Tr: NOT DETECTED
N gonorrhoeae: NOT DETECTED

## 2021-06-25 LAB — SALICYLATE LEVEL: Salicylate Lvl: <7 mg/dL — ABNORMAL LOW (ref 7.0–30.0)

## 2021-06-25 LAB — ETHANOL: Alcohol, Ethyl (B): <10 mg/dL (ref <10)

## 2021-06-25 NOTE — ED Notes (Signed)
MD at bedside. 

## 2021-06-25 NOTE — ED Provider Triage Note (Signed)
Emergency Medicine Provider Triage Evaluation Note  Jack Wolf , a 26 y.o. male  was evaluated in triage.  Pt complains of depression and anxiety. She was sent to the ER by [facility] after completing a questionnaire that indicated she needs SI evaluation. She is here voluntarily.  Review of Systems  Positive: Depression, anxiety Negative: SI or HI currently  Physical Exam  There were no vitals taken for this visit. Gen:   Awake, no distress   Resp:  Normal effort  MSK:   Moves extremities without difficulty  Other:    Medical Decision Making  Medically screening exam initiated at 4:56 PM.  Appropriate orders placed.  Jack Wolf was informed that the remainder of the evaluation will be completed by another provider, this initial triage assessment does not replace that evaluation, and the importance of remaining in the ED until their evaluation is complete.    Chinita Pester, FNP 06/25/21 1702

## 2021-06-25 NOTE — ED Triage Notes (Signed)
Pt to ED via Police from [facility]. Pt was there and answered questions regarding depression and anxiety they were concerned so they sent them here. They are no on medication for depression or anxiety. They deny any SI or HI. Pt is not IVC they are calm and cooperative.

## 2021-06-25 NOTE — ED Notes (Signed)
Pt arrived and acclimated to unit.  Pt denies all SI/HI stated she has never had any A/V hallucinations.  Denies need for transfer here to [facility]. Pt stated that she answered a questionnaire honestly and reported that I filled out "sometimes I can feel like I don't want to be here, but come on I'm human"  Pt has good eye contact, with normalized speech patters and volume.  Cont to monitor as ordered

## 2021-06-25 NOTE — ED Notes (Signed)
[name] at time of discharge.  Pt denies all SI/HI reports no A/V hallucinations.  Discharge instructions reviewed with patient , Mr Jack Wolf denies any questions

## 2021-06-25 NOTE — ED Notes (Signed)
VOl pending consult  

## 2021-06-25 NOTE — ED Provider Notes (Signed)
[facility]  [facility] Provider Note    Event Date/Time   First MD Initiated Contact with Patient 06/25/21 1710     (approximate)   History   Depression and Anxiety  HPI Jack Wolf is a 26 y.o. male who presents in police custody from an outpatient clinic after she was filling out paperwork and checked a box for "sometimes I think about not living".  Patient states that she feels that she has a history of depression and has thought about how to end her life in the past however denies any active suicidal ideation at this time.  Patient also denies any homicidal ideation or auditory/visual hallucinations.  Patient states that she has been more depressed and anxious recently and would like to get into see someone in psychology/psychiatry which was one of the reasons she presented to the clinic today.     Physical Exam   Triage Vital Signs: ED Triage Vitals  Enc Vitals Group     BP 06/25/21 1705 (!) 152/95     Pulse Rate 06/25/21 1705 75     Resp 06/25/21 1705 20     Temp 06/25/21 1705 98.2 F (36.8 C)     Temp Source 06/25/21 1705 Oral     SpO2 06/25/21 1705 98 %     Weight 06/25/21 1706 (!) 361 lb (163.7 kg)     Height 06/25/21 1706 5\' 11"  (1.803 m)     Head Circumference --      Peak Flow --      Pain Score 06/25/21 1705 9     Pain Loc --      Pain Edu? --      Excl. in GC? --     Most recent vital signs: Vitals:   06/25/21 1705  BP: (!) 152/95  Pulse: 75  Resp: 20  Temp: 98.2 F (36.8 C)  SpO2: 98%    General: Awake, no distress.  CV:  Good peripheral perfusion.  Resp:  Normal effort.  Abd:  No distention.  Other:  Young obese African-American male in bed with good insight   ED Results / Procedures / Treatments   Labs (all labs ordered are listed, but only abnormal results are displayed) Labs Reviewed  CBC - Abnormal; Notable for the following components:      Result Value   MCV 76.4 (*)    All other components within normal  limits  CHLAMYDIA/NGC RT PCR ([facility] ONLY)            COMPREHENSIVE METABOLIC PANEL  ETHANOL  SALICYLATE LEVEL  ACETAMINOPHEN LEVEL  URINE DRUG SCREEN, QUALITATIVE ([facility] ONLY)  RPR   PROCEDURES:  Critical Care performed: No  Procedures   MEDICATIONS ORDERED IN ED: Medications - No data to display   IMPRESSION / MDM / ASSESSMENT AND PLAN / ED COURSE  I reviewed the triage vital signs and the nursing notes.                              Thoughts are linear and organized, and patient has no SI, AH, VH, or HI. Prior suicide attempt: Denies Prior Psychiatric Hospitalizations: Denies  Clinically patient displays no overt toxidrome; they are well appearing, with low suspicion for toxic ingestion given history and exam. Thoughts unlikely 2/2 anemia, hypothyroidism, infection, or ICH. Patient does not meet any criteria for IVC at this time.  As we do not have psychiatrist overnight, I explained  to patient the options for care including staying the night and seeing our staff in the morning or following up at [facility] clinic tomorrow.  Patient would prefer to follow-up at the outpatient psychiatry clinic tomorrow and was given all requisite information for follow-up.  Continues to deny any SI, HI, AVH and has been calm, cooperative, and with good insight throughout her emergency department course.  Patient contracted for safety prior to discharge Disposition: Discharge home with follow-up with outpatient psychiatry      FINAL CLINICAL IMPRESSION(S) / ED DIAGNOSES   Final diagnoses:  Depression, unspecified depression type  Anxiety     Rx / DC Orders   ED Discharge Orders     None        Note:  This document was prepared using Dragon voice recognition software and may include unintentional dictation errors.   Merwyn Katos, MD 06/25/21 [phone]

## 2021-08-07 ENCOUNTER — Other Ambulatory Visit: Payer: Self-pay

## 2021-08-07 ENCOUNTER — Encounter: Payer: Self-pay | Admitting: Family Medicine

## 2021-08-07 ENCOUNTER — Ambulatory Visit: Payer: Medicaid Other | Admitting: Family Medicine

## 2021-08-07 DIAGNOSIS — Z113 Encounter for screening for infections with a predominantly sexual mode of transmission: Secondary | ICD-10-CM | POA: Diagnosis not present

## 2021-08-07 LAB — HEPATITIS B SURFACE ANTIGEN: Hepatitis B Surface Ag: NONREACTIVE

## 2021-08-07 LAB — HM HIV SCREENING LAB: HM HIV Screening: NEGATIVE

## 2021-08-07 NOTE — Progress Notes (Signed)
[facility] Department ?STI clinic/screening visit ? ?Subjective:  ?Jack Wolf is a 26 y.o. male being seen today for an STI screening visit. The patient reports they do not have symptoms.   ? ?Patient has the following medical conditions:   ?Patient Active Problem List  ? Diagnosis Date Noted  ? Mood disorder (HCC) 06/25/2018  ? ? ? ?Chief Complaint  ?Patient presents with  ? SEXUALLY TRANSMITTED DISEASE  ? ? ?HPI ? ?Patient reports that  ? ?Does the patient or their partner desires a pregnancy in the next year? No ? ?Screening for MPX risk: ?Does the patient have an unexplained rash? No ?Is the patient MSM? No ?Does the patient endorse multiple sex partners or anonymous sex partners? No ?Did the patient have close or sexual contact with a person diagnosed with MPX? No ?Has the patient traveled outside the [place] where MPX is endemic? No ?Is there a high clinical suspicion for MPX-- evidenced by one of the following No ? -Unlikely to be chickenpox ? -Lymphadenopathy ? -Rash that present in same phase of evolution on any given body part ? ? ?See flowsheet for further details and programmatic requirements.  ? ? ?The following portions of the patient's history were reviewed and updated as appropriate: allergies, current medications, past medical history, past social history, past surgical history and problem list. ? ?Objective:  ?There were no vitals filed for this visit. ? ?Physical Exam ?Constitutional:   ?   Appearance: Normal appearance. She is obese.  ?HENT:  ?   Head: Normocephalic and atraumatic.  ?   Comments: No nits or hair loss ?   Mouth/Throat:  ?   Mouth: Mucous membranes are moist.  ?   Pharynx: Oropharynx is clear. No oropharyngeal exudate or posterior oropharyngeal erythema.  ?Pulmonary:  ?   Effort: Pulmonary effort is normal.  ?Abdominal:  ?   General: Abdomen is flat.  ?   Palpations: Abdomen is soft. There is no hepatomegaly or mass.  ?   Tenderness: There is no abdominal tenderness.   ?Genitourinary: ?   Pubic Area: No rash or pubic lice.   ?   Penis: Normal.   ?   Testes: Normal.  ?   Epididymis:  ?   Right: Normal.  ?   Left: Normal.  ?   Rectum: Normal.  ?   Comments: Stool noted in the gluteal folds ?Lymphadenopathy:  ?   Head:  ?   Right side of head: No preauricular or posterior auricular adenopathy.  ?   Left side of head: No preauricular or posterior auricular adenopathy.  ?   Cervical: No cervical adenopathy.  ?   Upper Body:  ?   Right upper body: No supraclavicular or axillary adenopathy.  ?   Left upper body: No supraclavicular or axillary adenopathy.  ?   Lower Body: No right inguinal adenopathy. No left inguinal adenopathy.  ?Skin: ?   General: Skin is warm and dry.  ?   Findings: No rash.  ?Neurological:  ?   Mental Status: She is alert and oriented to person, place, and time.  ? ? ?Assessment and Plan:  ?[name] is a 26 y.o. male presenting to the [facility] Department for STI screening ? ?1. Screening examination for venereal disease ? ?- Chlamydia/Gonorrhea [facility] ?- Hepatitis Serology, [place] [facility] ?- HIV [place] [facility] ?- Syphilis Serology, [place] [facility] ?- Chlamydia/Gonorrhea [place] [facility] ?- Chlamydia/Gonorrhea  Lab ? ?  Co. To continue to use condoms for all sexual activity. ?Client plans to make appt. To restart PrEP. ? ? ?No follow-ups on file. ? ?No future appointments. ? ?Larene Pickett, FNP ? ?

## 2021-08-07 NOTE — Progress Notes (Signed)
Here for STD testing. Taking estradiol, unsure dose, and taking another medication, unsure of name.Jenetta Downer, RN  ?

## 2021-08-16 ENCOUNTER — Telehealth: Payer: Self-pay

## 2021-08-16 NOTE — Telephone Encounter (Signed)
Received call back from pt. Pt confirmed identity. Counseled pt regarding CT and GC results. ?Tx appt scheduled for 08/19/21 ?Pt states NKA to meds or foods. ?

## 2021-08-16 NOTE — Telephone Encounter (Signed)
Calling pt regarding positive results from 08/07/21 specimens: chlamydia positive rectal specimen, gonorrhea positive oropharyngeal specimen. Pt needs tx appt. ? ? ?Phone call to pt at [phone]. Left message on voicemail that RN with [facility] is calling regarding TR. Please call Govind Furey at [phone]. ? ?(Also sent MyChart message.) ?

## 2021-08-19 NOTE — Telephone Encounter (Signed)
Pt called.  States already received medication at [facility].   ?See Care Everywhere visit dated 08/16/21 for details, documented tx with Ceftriaxone and Doxycycline.  ?

## 2021-09-21 ENCOUNTER — Emergency Department
Admission: EM | Admit: 2021-09-21 | Discharge: 2021-09-21 | Disposition: A | Discharge status: Home / Self Care | Payer: Medicaid Other | Attending: Emergency Medicine | Admitting: Emergency Medicine

## 2021-09-21 ENCOUNTER — Other Ambulatory Visit: Payer: Self-pay

## 2021-09-21 ENCOUNTER — Encounter: Payer: Self-pay | Admitting: Emergency Medicine

## 2021-09-21 DIAGNOSIS — Z20822 Contact with and (suspected) exposure to covid-19: Secondary | ICD-10-CM | POA: Insufficient documentation

## 2021-09-21 DIAGNOSIS — J029 Acute pharyngitis, unspecified: Secondary | ICD-10-CM | POA: Diagnosis present

## 2021-09-21 LAB — RESP PANEL BY RT-PCR (FLU A&B, COVID) ARPGX2
Influenza A by PCR: NEGATIVE
Influenza B by PCR: NEGATIVE
SARS Coronavirus 2 by RT PCR: NEGATIVE

## 2021-09-21 LAB — GROUP A STREP BY PCR: Group A Strep by PCR: NOT DETECTED

## 2021-09-21 MED ORDER — LIDOCAINE HCL (PF) 1 % IJ SOLN
INTRAMUSCULAR | Status: AC
  Administered 2021-09-21: 5 mL
  Filled 2021-09-21: qty 5

## 2021-09-21 MED ORDER — DOXYCYCLINE HYCLATE 100 MG PO TABS: 100.0000 mg | ORAL_TABLET | Freq: Two times a day (BID) | ORAL | 0 refills | Status: DC

## 2021-09-21 MED ORDER — CEFTRIAXONE SODIUM 1 G IJ SOLR
500.0000 mg | Freq: Once | INTRAMUSCULAR | Status: AC
  Administered 2021-09-21: 500 mg via INTRAMUSCULAR
  Filled 2021-09-21: qty 10

## 2021-09-21 MED ORDER — CETIRIZINE HCL 10 MG PO TABS: 10.0000 mg | ORAL_TABLET | Freq: Every day | ORAL | 0 refills | Status: DC

## 2021-09-21 MED ORDER — LIDOCAINE HCL (PF) 1 % IJ SOLN
5.0000 mL | Freq: Once | INTRAMUSCULAR | Status: AC
Start: 2021-09-21 — End: 2021-09-21

## 2021-09-21 MED ORDER — FLUTICASONE PROPIONATE 50 MCG/ACT NA SUSP: 1.0000 | Freq: Two times a day (BID) | NASAL | 0 refills | Status: DC

## 2021-09-21 NOTE — ED Provider Notes (Signed)
? ?[facility] ?Provider Note ? ?Patient Contact: 9:52 PM (approximate) ? ? ?History  ? ?URI and Exposure to STD ? ? ?HPI ? ?Jack Wolf is a 26 y.o. adult who presents the emergency department complaining of sore throat.  No fevers, chills, nasal congestion, cough, GI symptoms.  Patient is concerned that she may have gonorrhea.  I saw the patient in December 2022 with similar complaint with a diagnosis of gonorrhea.  Patient also tested positive for gonorrhea in January.  Patient recently had full STD testing in March with negative gonorrhea.  Patient denies any specific known contact given the symptoms wanted to ensure that we tested for viral illness, strep and gonorrhea. ?  ? ? ?Physical Exam  ? ?Triage Vital Signs: ?ED Triage Vitals  ?Enc Vitals Group  ?   BP 09/21/21 2121 (!) 151/83  ?   Pulse Rate 09/21/21 2121 84  ?   Resp 09/21/21 2121 19  ?   Temp 09/21/21 2121 98.7 ?F (37.1 ?C)  ?   Temp Source 09/21/21 2121 Oral  ?   SpO2 09/21/21 2121 96 %  ?   Weight 09/21/21 2121 (!) 356 lb (161.5 kg)  ?   Height 09/21/21 2121 5\' 11"  (1.803 m)  ?   Head Circumference --   ?   Peak Flow --   ?   Pain Score 09/21/21 2121 9  ?   Pain Loc --   ?   Pain Edu? --   ?   Excl. in [place]? --   ? ? ?Most recent vital signs: ?Vitals:  ? 09/21/21 2121  ?BP: (!) 151/83  ?Pulse: 84  ?Resp: 19  ?Temp: 98.7 ?F (37.1 ?C)  ?SpO2: 96%  ? ? ? ?General: Alert and in no acute distress. ?ENT: ?     Ears:  ?     Nose: No congestion/rhinnorhea. ?     Mouth/Throat: Mucous membranes are moist.  Tonsillar erythema but no gross edema and no exudates present.  Uvula is midline. ?Neck: No stridor. No cervical spine tenderness to palpation ?Hematological/Lymphatic/Immunilogical: No cervical lymphadenopathy. ?Cardiovascular:  Good peripheral perfusion ?Respiratory: Normal respiratory effort without tachypnea or retractions. Lungs CTAB. Good air entry to the bases with no decreased or absent breath sounds. ?Musculoskeletal: Full  range of motion to all extremities.  ?Neurologic:  No gross focal neurologic deficits are appreciated.  ?Skin:   No rash noted ?Other: ? ? ?ED Results / Procedures / Treatments  ? ?Labs ?(all labs ordered are listed, but only abnormal results are displayed) ?Labs Reviewed  ?GROUP A STREP BY PCR  ?RESP PANEL BY RT-PCR (FLU A&B, COVID) ARPGX2  ?CHLAMYDIA/NGC RT PCR ([facility] ONLY)            ? ? ? ?EKG ? ? ? ? ?RADIOLOGY ? ? ? ?No results found. ? ?PROCEDURES: ? ?Critical Care performed: No ?Procedures ? ? ?MEDICATIONS ORDERED IN ED: ?Medications  ?cefTRIAXone (ROCEPHIN) injection 500 mg (has no administration in time range)  ?lidocaine (PF) (XYLOCAINE) 1 % injection 5 mL (has no administration in time range)  ?lidocaine (PF) (XYLOCAINE) 1 % injection (has no administration in time range)  ? ? ? ?IMPRESSION / MDM / ASSESSMENT AND PLAN / ED COURSE  ?I reviewed the triage vital signs and the nursing notes. ?             ?               ? ?Differential diagnosis  includes, but is not limited to, gonorrhea, chlamydia, strep, COVID, postnasal drip, allergic rhinitis, influenza, peritonsillar abscess, retropharyngeal abscess ? ? ?Patient's diagnosis is consistent with sore throat.  Patient presents to the emergency department for testing for a sore throat that began today.  No associated fevers, chills, nasal congestion, cough.  Patient has been seen in the past with similar symptoms with a positive gonorrhea test from oropharyngeal swab.  Patient tested positive both in December and January of this year though she did recently have a full STD panel with negative results for gonorrhea chlamydia.  Patient states there is a good chance of contact with a an individual positive for gonorrhea.  As such I will treat empirically at this time with Rocephin and doxycycline.  Patient has had a recent similar symptoms with positive oral swabs.  Patient also endorses some intermittent nasal congestion consistent with allergic rhinitis.  I  will prescribe doxycycline for potential ICD add Flonase and Zyrtec for allergic rhinitis type symptoms.  Concerning signs and symptoms are discussed with the patient.  Return precautions discussed with the patient.  Follow-up with primary care as needed... Patient is given ED precautions to return to the ED for any worsening or new symptoms. ? ? ? ?  ? ? ?FINAL CLINICAL IMPRESSION(S) / ED DIAGNOSES  ? ?Final diagnoses:  ?Sore throat  ? ? ? ?Rx / DC Orders  ? ?ED Discharge Orders   ? ?      Ordered  ?  doxycycline (VIBRA-TABS) 100 MG tablet  2 times daily       ? 09/21/21 2310  ?  fluticasone (FLONASE) 50 MCG/ACT nasal spray  2 times daily       ? 09/21/21 2310  ?  cetirizine (ZYRTEC) 10 MG tablet  Daily       ? 09/21/21 2310  ? ?  ?  ? ?  ? ? ? ?Note:  This document was prepared using Dragon voice recognition software and may include unintentional dictation errors. ?  ?Racheal Patches, PA-C ?09/21/21 2315 ? ?  ?Phineas Semen, MD ?09/21/21 2355 ? ?

## 2021-09-21 NOTE — ED Triage Notes (Signed)
Pt to ED via POV. Pt states "I want to get tested for Covid, strep, and gonorrhea". Pt states would like to be tested for gonorrhea in his throat. Pt states URI symptoms x several days.  ?

## 2021-09-22 LAB — CHLAMYDIA/NGC RT PCR (ARMC ONLY)
Chlamydia Tr: NOT DETECTED
N gonorrhoeae: NOT DETECTED

## 2021-11-07 ENCOUNTER — Other Ambulatory Visit: Payer: Self-pay

## 2021-11-07 DIAGNOSIS — J019 Acute sinusitis, unspecified: Secondary | ICD-10-CM | POA: Diagnosis not present

## 2021-11-07 DIAGNOSIS — J029 Acute pharyngitis, unspecified: Secondary | ICD-10-CM | POA: Diagnosis present

## 2021-11-07 DIAGNOSIS — Z20822 Contact with and (suspected) exposure to covid-19: Secondary | ICD-10-CM | POA: Insufficient documentation

## 2021-11-07 NOTE — ED Triage Notes (Signed)
Ambulatory to triage. Pt states " I want to see if I have strep or chlamydia to my throat." Reports sore throat began apx 5 days ago. Denies fever at home. Endorses nasal congestion and nausea for past 5 days as well.

## 2021-11-08 ENCOUNTER — Emergency Department
Admission: EM | Admit: 2021-11-08 | Discharge: 2021-11-08 | Disposition: A | Discharge status: Home / Self Care | Payer: Medicaid Other | Attending: Emergency Medicine | Admitting: Emergency Medicine

## 2021-11-08 DIAGNOSIS — J019 Acute sinusitis, unspecified: Secondary | ICD-10-CM

## 2021-11-08 DIAGNOSIS — J029 Acute pharyngitis, unspecified: Secondary | ICD-10-CM

## 2021-11-08 LAB — SARS CORONAVIRUS 2 BY RT PCR: SARS Coronavirus 2 by RT PCR: NEGATIVE

## 2021-11-08 LAB — CHLAMYDIA/NGC RT PCR (ARMC ONLY)
Chlamydia Tr: NOT DETECTED
N gonorrhoeae: NOT DETECTED

## 2021-11-08 LAB — GROUP A STREP BY PCR: Group A Strep by PCR: NOT DETECTED

## 2021-11-08 MED ORDER — ONDANSETRON 4 MG PO TBDP: 4.0000 mg | ORAL_TABLET | Freq: Three times a day (TID) | ORAL | 0 refills | Status: AC | PRN

## 2021-11-08 MED ORDER — IBUPROFEN 400 MG PO TABS
400.0000 mg | ORAL_TABLET | Freq: Once | ORAL | Status: AC
  Administered 2021-11-08: 400 mg via ORAL
  Filled 2021-11-08: qty 1

## 2021-11-08 MED ORDER — ACETAMINOPHEN 500 MG PO TABS
1000.0000 mg | ORAL_TABLET | Freq: Once | ORAL | Status: AC
  Administered 2021-11-08: 1000 mg via ORAL
  Filled 2021-11-08: qty 2

## 2021-11-08 MED ORDER — ONDANSETRON 4 MG PO TBDP
4.0000 mg | ORAL_TABLET | Freq: Once | ORAL | Status: AC
  Administered 2021-11-08: 4 mg via ORAL
  Filled 2021-11-08: qty 1

## 2021-11-08 NOTE — ED Provider Notes (Signed)
[facility] Provider Note    Event Date/Time   First MD Initiated Contact with Patient 11/08/21 0330     (approximate)   History   Sore Throat   HPI  Jack Wolf is a 26 y.o. adult born biologically male who identifies as male who presents for evaluation after 4 days of congestion, sore throat and some nausea.  No earache, fevers, neck stiffness, chest pain, cough, shortness of breath, back pain, difficulty swallowing although patient does have some pain with swallowing.  No rash, urinary symptoms, Donnell pain or any other clear associated sick symptoms.  No recent trauma.  Patient notes she is supposed to be taking blood pressure medication but has not been in the last couple days.      Physical Exam  Triage Vital Signs: ED Triage Vitals  Enc Vitals Group     BP 11/07/21 2344 (!) 155/99     Pulse Rate 11/07/21 2344 (!) 103     Resp 11/07/21 2344 20     Temp 11/07/21 2344 98.6 F (37 C)     Temp src --      SpO2 11/07/21 2344 97 %     Weight 11/07/21 2345 (!) 361 lb (163.7 kg)     Height 11/07/21 2345 5\' 11"  (1.803 m)     Head Circumference --      Peak Flow --      Pain Score 11/07/21 2345 8     Pain Loc --      Pain Edu? --      Excl. in [place]? --     Most recent vital signs: Vitals:   11/07/21 2344  BP: (!) 155/99  Pulse: (!) 103  Resp: 20  Temp: 98.6 F (37 C)  SpO2: 97%    General: Awake, no distress.  CV:  Good peripheral perfusion.  2+ radial pulses Resp:  Normal effort.  Clear bilaterally. Abd:  No distention.  Soft. Other:  Mild posterior oropharyngeal erythema without uvular deviation or tonsillar lodgment.  There is no hoarseness.  Patient is able to tolerate p.o. without difficulty.  Some slight anterior cervical lymphadenopathy but no meningismus.  TMs unremarkable bilaterally.  Other obvious thrush infection on the face scalp head or neck.   ED Results / Procedures / Treatments  Labs (all labs ordered are listed,  but only abnormal results are displayed) Labs Reviewed  GROUP A STREP BY PCR  SARS CORONAVIRUS 2 BY RT PCR  CHLAMYDIA/NGC RT PCR ([facility] ONLY)               EKG    RADIOLOGY   PROCEDURES:  Critical Care performed: No  Procedures    MEDICATIONS ORDERED IN ED: Medications  acetaminophen (TYLENOL) tablet 1,000 mg (has no administration in time range)  ibuprofen (ADVIL) tablet 400 mg (has no administration in time range)  ondansetron (ZOFRAN-ODT) disintegrating tablet 4 mg (has no administration in time range)     IMPRESSION / MDM / ASSESSMENT AND PLAN / ED COURSE  I reviewed the triage vital signs and the nursing notes. Patient's presentation is most consistent with acute presentation with potential threat to life or bodily function.                               Differential diagnosis includes, but is not limited to strep pharyngitis, viral pharyngitis, sinusitis, with overall lower suspicion for retropharyngeal or peritonsillar abscess, meningitis,  mastoiditis, otitis media, sepsis or pneumonia based on patient's history and exam.  Advised patient of importance of taking blood pressure medication as she is known to be from hypertensive today.  Will write Rx for Zofran for patient's nausea and advised using Tylenol ibuprofen to help with a sore throat.  Discussed returning for any new or worsening of symptoms.  Discharged in stable condition.  Strict return precautions advised and discussed.         FINAL CLINICAL IMPRESSION(S) / ED DIAGNOSES   Final diagnoses:  Pharyngitis, unspecified etiology  Acute sinusitis, recurrence not specified, unspecified location     Rx / DC Orders   ED Discharge Orders          Ordered    ondansetron (ZOFRAN-ODT) 4 MG disintegrating tablet  Every 8 hours PRN        11/08/21 [id number]             Note:  This document was prepared using Dragon voice recognition software and may include unintentional dictation errors.   Lucrezia Starch, MD 11/08/21 [phone]

## 2021-11-08 NOTE — ED Notes (Signed)
Patient discharged to home per MD order. Patient in stable condition, and deemed medically cleared by ED provider for discharge. Discharge instructions reviewed with patient/family using "Teach Back"; verbalized understanding of medication education and administration, and information about follow-up care. Denies further concerns. ° °

## 2022-02-24 ENCOUNTER — Other Ambulatory Visit: Payer: Self-pay

## 2022-02-24 DIAGNOSIS — Z20822 Contact with and (suspected) exposure to covid-19: Secondary | ICD-10-CM | POA: Insufficient documentation

## 2022-02-24 DIAGNOSIS — R519 Headache, unspecified: Secondary | ICD-10-CM | POA: Diagnosis not present

## 2022-02-24 DIAGNOSIS — K629 Disease of anus and rectum, unspecified: Secondary | ICD-10-CM | POA: Insufficient documentation

## 2022-02-24 DIAGNOSIS — M791 Myalgia, unspecified site: Secondary | ICD-10-CM | POA: Insufficient documentation

## 2022-02-24 DIAGNOSIS — J029 Acute pharyngitis, unspecified: Secondary | ICD-10-CM | POA: Insufficient documentation

## 2022-02-24 DIAGNOSIS — R059 Cough, unspecified: Secondary | ICD-10-CM | POA: Diagnosis not present

## 2022-02-24 LAB — GROUP A STREP BY PCR: Group A Strep by PCR: NOT DETECTED

## 2022-02-24 NOTE — ED Provider Triage Note (Signed)
Emergency Medicine Provider Triage Evaluation Note  Jack Wolf , a 26 y.o. adult  was evaluated in triage.  Pt complains of uri symptoms. Patient with body aches, sore throat, cough. Symptoms x 5 days. Also wants STD checks for chlamydia both anally and orally  Review of Systems  Positive: Sore throat, cough, body aches Negative: CP, GI symptoms  Physical Exam  BP (!) 159/93   Pulse 84   Temp 99.5 F (37.5 C)   Resp (!) 98   Ht 5\' 11"  (1.803 m)   Wt (!) 165.1 kg   SpO2 100%   BMI 50.77 kg/m  Gen:   Awake, no distress   Resp:  Normal effort  MSK:   Moves extremities without difficulty  Other:    Medical Decision Making  Medically screening exam initiated at 11:12 PM.  Appropriate orders placed.  Jack Wolf was informed that the remainder of the evaluation will be completed by another provider, this initial triage assessment does not replace that evaluation, and the importance of remaining in the ED until their evaluation is complete.  Patient with URI symptoms. Flu/covid, strep swab, GC/Chlamydia   Darletta Moll, PA-C 02/24/22 2312

## 2022-02-24 NOTE — ED Triage Notes (Signed)
Pt states that they have had generalized body aches, sore throat, cough for the past 5 days.

## 2022-02-25 ENCOUNTER — Emergency Department
Admission: EM | Admit: 2022-02-25 | Discharge: 2022-02-25 | Disposition: A | Discharge status: Home / Self Care | Payer: Medicaid Other | Attending: Emergency Medicine | Admitting: Emergency Medicine

## 2022-02-25 DIAGNOSIS — K6289 Other specified diseases of anus and rectum: Secondary | ICD-10-CM

## 2022-02-25 DIAGNOSIS — J029 Acute pharyngitis, unspecified: Secondary | ICD-10-CM

## 2022-02-25 DIAGNOSIS — R059 Cough, unspecified: Secondary | ICD-10-CM

## 2022-02-25 DIAGNOSIS — M791 Myalgia, unspecified site: Secondary | ICD-10-CM

## 2022-02-25 LAB — CHLAMYDIA/NGC RT PCR (ARMC ONLY)
Chlamydia Tr: NOT DETECTED
Chlamydia Tr: NOT DETECTED
N gonorrhoeae: NOT DETECTED
N gonorrhoeae: NOT DETECTED

## 2022-02-25 LAB — RESP PANEL BY RT-PCR (FLU A&B, COVID) ARPGX2
Influenza A by PCR: NEGATIVE
Influenza B by PCR: NEGATIVE
SARS Coronavirus 2 by RT PCR: NEGATIVE

## 2022-02-25 MED ORDER — DOXYCYCLINE HYCLATE 100 MG PO TABS
100.0000 mg | ORAL_TABLET | Freq: Once | ORAL | Status: AC
  Administered 2022-02-25: 100 mg via ORAL
  Filled 2022-02-25: qty 1

## 2022-02-25 MED ORDER — LIDOCAINE HCL (PF) 1 % IJ SOLN
1.0000 mL | Freq: Once | INTRAMUSCULAR | Status: AC
  Administered 2022-02-25: 2.1 mL
  Filled 2022-02-25: qty 5

## 2022-02-25 MED ORDER — DOXYCYCLINE MONOHYDRATE 100 MG PO TABS: 100.0000 mg | ORAL_TABLET | Freq: Two times a day (BID) | ORAL | 0 refills | Status: AC

## 2022-02-25 MED ORDER — CEFTRIAXONE SODIUM 1 G IJ SOLR
1.0000 g | Freq: Once | INTRAMUSCULAR | Status: AC
  Administered 2022-02-25: 1 g via INTRAMUSCULAR
  Filled 2022-02-25: qty 10

## 2022-02-25 NOTE — Discharge Instructions (Addendum)
Take antibiotic as prescribed.  See your doctor this week for a follow up appointment, check your test results, and get HIV testing like we talked about.  Thank you for choosing [place] for your health care today!  Please see your primary doctor this week for a follow up appointment.   If you do not have a primary doctor call the following clinics to establish care:  If you have insurance:  [facility] [phone] [place] [address]   [facility]  [phone] [place] [place] [address]   If you do not have insurance:  [facility]  [phone] [address], [place] [address]  Sometimes, in the early stages of certain disease courses it is difficult to detect in the emergency department evaluation -- so, it is important that you continue to monitor your symptoms and call your doctor right away or return to the emergency department if you develop any new or worsening symptoms.  It was my pleasure to care for you today.   Hoover Brunette Jacelyn Grip, MD

## 2022-02-25 NOTE — ED Provider Notes (Signed)
[facility] Provider Note    Event Date/Time   First MD Initiated Contact with Patient 02/25/22 0530     (approximate)   History   Sore Throat   HPI  Jack Wolf is a 26 y.o. adult   Past medical history of STIs who presents with throat pain, rectal pain, myalgias, cough and headache for the last 5 days.  Concern for STI given new sexual contact, oral and anal receptive sex.  Denies penile discharge or testicular pain.  History was obtained via the patient and review of external medical notes.      Physical Exam   Triage Vital Signs: ED Triage Vitals  Enc Vitals Group     BP 02/24/22 2309 (!) 159/93     Pulse Rate 02/24/22 2309 84     Resp 02/24/22 2309 (!) 98     Temp 02/24/22 2309 99.5 F (37.5 C)     Temp src --      SpO2 02/24/22 2309 100 %     Weight 02/24/22 2306 (!) 364 lb (165.1 kg)     Height 02/24/22 2306 5\' 11"  (1.803 m)     Head Circumference --      Peak Flow --      Pain Score 02/24/22 2306 3     Pain Loc --      Pain Edu? --      Excl. in [place]? --     Most recent vital signs: Vitals:   02/24/22 2309  BP: (!) 159/93  Pulse: 84  Resp: (!) 98  Temp: 99.5 F (37.5 C)  SpO2: 100%    General: Awake, no distress.  CV:  Good peripheral perfusion.  Resp:  Normal effort.  Abd:  No distention.  Other:  The testicles appear normal without tenderness to palpation or skin changes.  No obvious penile discharge.  The patient deferred rectal exam.  Abdomen is soft and nontender.  Nontoxic appearance.  Afebrile.  Oropharynx without obvious exudates or masses.  Phonation is normal.  Neck Is supple with full range of motion   ED Results / Procedures / Treatments   Labs (all labs ordered are listed, but only abnormal results are displayed) Labs Reviewed  RESP PANEL BY RT-PCR (FLU A&B, COVID) ARPGX2  GROUP A STREP BY PCR  CHLAMYDIA/NGC RT PCR ([facility] ONLY)               I reviewed labs and they are notable for negative  respiratory viral panel and group A strep.  PROCEDURES:  Critical Care performed: No  Procedures   MEDICATIONS ORDERED IN ED: Medications  cefTRIAXone (ROCEPHIN) injection 1 g (has no administration in time range)  lidocaine (PF) (XYLOCAINE) 1 % injection 1-2.1 mL (has no administration in time range)  doxycycline (VIBRA-TABS) tablet 100 mg (has no administration in time range)     IMPRESSION / MDM / ASSESSMENT AND PLAN / ED COURSE  I reviewed the triage vital signs and the nursing notes.                              Differential diagnosis includes, but is not limited to, viral illness, sexually transmitted infection, prostatitis, HIV infection.    MDM: Viral testing & group A strep negative.  Patient opts for empiric treatment of sexually transmitted infection.  Ceftriaxone and doxycycline ordered in the emergency department and a prescription for doxycycline 14 days to cover  for potential sexually transmitted prostatitis as well.  Patient expressed hesitancy and deferred rectal exam in the emergency department.  This patient also deferred HIV testing at this time, stating that he takes postexposure prophylaxis but understands that acute HIV infection is on the differential diagnosis and will opt for close PMD follow-up for testing because he is concerned about IV access today Patient's presentation is most consistent with acute presentation with potential threat to life or bodily function.       FINAL CLINICAL IMPRESSION(S) / ED DIAGNOSES   Final diagnoses:  Sore throat  Anal pain  Myalgia  Cough, unspecified type     Rx / DC Orders   ED Discharge Orders          Ordered    doxycycline (ADOXA) 100 MG tablet  2 times daily        02/25/22 0553             Note:  This document was prepared using Dragon voice recognition software and may include unintentional dictation errors.    Lucillie Garfinkel, MD 02/25/22 [phone]

## 2022-03-04 ENCOUNTER — Ambulatory Visit: Payer: Medicaid Other

## 2022-03-04 ENCOUNTER — Emergency Department: Payer: Medicaid Other

## 2022-03-04 ENCOUNTER — Emergency Department
Admission: EM | Admit: 2022-03-04 | Discharge: 2022-03-05 | Disposition: A | Discharge status: Home / Self Care | Payer: Medicaid Other | Attending: Emergency Medicine | Admitting: Emergency Medicine

## 2022-03-04 ENCOUNTER — Other Ambulatory Visit: Payer: Self-pay

## 2022-03-04 DIAGNOSIS — K5900 Constipation, unspecified: Secondary | ICD-10-CM | POA: Insufficient documentation

## 2022-03-04 DIAGNOSIS — K6289 Other specified diseases of anus and rectum: Secondary | ICD-10-CM | POA: Diagnosis present

## 2022-03-04 MED ORDER — LACTULOSE 20 GM/30ML PO SOLN: 30.0000 mL | Freq: Every day | ORAL | 0 refills | Status: DC

## 2022-03-04 MED ORDER — GLYCERIN (LAXATIVE) 2 G RE SUPP
1.0000 | Freq: Once | RECTAL | Status: AC
  Administered 2022-03-04: 1 via RECTAL
  Filled 2022-03-04: qty 1

## 2022-03-04 MED ORDER — FLEET ENEMA 7-19 GM/118ML RE ENEM: 1.0000 | ENEMA | Freq: Once | RECTAL | Status: DC

## 2022-03-04 MED ORDER — MAGNESIUM CITRATE PO SOLN
1.0000 | Freq: Once | ORAL | Status: AC
  Administered 2022-03-04: 1 via ORAL
  Filled 2022-03-04: qty 296

## 2022-03-04 MED ORDER — LACTULOSE 10 GM/15ML PO SOLN
30.0000 g | Freq: Once | ORAL | Status: AC
  Administered 2022-03-04: 30 g via ORAL
  Filled 2022-03-04: qty 60

## 2022-03-04 NOTE — ED Notes (Signed)
Bedside commode next to stretcher; pt given bath wipes; pt given call bell; educated.

## 2022-03-04 NOTE — ED Triage Notes (Signed)
Pt comes with c/o rectum pain. Pt states just seen here recently for same. Pt states was informed to come back if got worse. Pt states it is hard to even sit down. Pt states it hurts at scrotum area back to rectum.  Pt states some pain with urination and hard to start the flow.

## 2022-03-04 NOTE — ED Provider Notes (Signed)
[facility] Emergency Department Provider Note     Event Date/Time   First MD Initiated Contact with Patient 03/04/22 1901     (approximate)   History   rectum pain   HPI  Jack Wolf is a 26 y.o. adult presents to the ED for evaluation of rectal pain.  She described a recent eval for the same complaint. She presents with pain at the perineum that is worse with sitting.  She reports that has been a week and a half since her last meaningful stool.  He is eating and drink without emesis or nausea.  She has tried intermittent scant doses of MiraLAX with limited benefit.  She denies any bright red blood per rectum, feculent emesis, or dysuria.  She does endorse some lower abdominal cramping and rectal for pressure.  No reports of any rectal abscess, purulent drainage noted.   Physical Exam   Triage Vital Signs: ED Triage Vitals  Enc Vitals Group     BP 03/04/22 1832 131/71     Pulse Rate 03/04/22 1832 82     Resp 03/04/22 1832 18     Temp 03/04/22 1832 98 F (36.7 C)     Temp src --      SpO2 03/04/22 1832 99 %     Weight --      Height --      Head Circumference --      Peak Flow --      Pain Score 03/04/22 1831 7     Pain Loc --      Pain Edu? --      Excl. in [place]? --     Most recent vital signs: Vitals:   03/04/22 1832 03/05/22 0027  BP: 131/71 122/79  Pulse: 82 79  Resp: 18 18  Temp: 98 F (36.7 C) 97.9 F (36.6 C)  SpO2: 99% 99%    General Awake, no distress.  CV:  Good peripheral perfusion.  RESP:  Normal effort.  ABD:  No distention. Soft, nontender. Rectal exam deferred   ED Results / Procedures / Treatments   Labs (all labs ordered are listed, but only abnormal results are displayed) Labs Reviewed - No data to display   EKG   RADIOLOGY  I personally viewed and evaluated these images as part of my medical decision making, as well as reviewing the written report by the radiologist.  ED Provider Interpretation:  moderate stool burden w/o SBO or rectal impaction  ABD 1 V  IMPRESSION: Mild colonic constipation.   PROCEDURES:  Critical Care performed: No  Procedures   MEDICATIONS ORDERED IN ED: Medications  Glycerin (Adult) 2 g suppository 1 suppository (1 suppository Rectal Given 03/04/22 2137)  lactulose (CHRONULAC) 10 GM/15ML solution 30 g (30 g Oral Given 03/04/22 2134)  magnesium citrate solution 1 Bottle (1 Bottle Oral Given 03/04/22 2134)     IMPRESSION / MDM / ASSESSMENT AND PLAN / ED COURSE  I reviewed the triage vital signs and the nursing notes.                              Differential diagnosis includes, but is not limited to, constipation, SBO, hemorrhoids, rectal abscess, fecal impaction, hemorrhoids, rectal fissure, pilonidal cyst abscess  Patient's presentation is most consistent with acute complicated illness / injury requiring diagnostic workup.  Patient's diagnosis is consistent with constipation. Patient will be discharged home with prescriptions for lactulose.  Patient is to follow up with GI medicine or primary provider as needed or otherwise directed. Patient is given ED precautions to return to the ED for any worsening or new symptoms.     FINAL CLINICAL IMPRESSION(S) / ED DIAGNOSES   Final diagnoses:  Constipation, unspecified constipation type     Rx / DC Orders   ED Discharge Orders          Ordered    Lactulose 20 GM/30ML SOLN  Daily        03/04/22 2356             Note:  This document was prepared using Dragon voice recognition software and may include unintentional dictation errors.    Melvenia Needles, PA-C 03/11/22 1942    Naaman Plummer, MD 03/24/22 [phone]

## 2022-03-04 NOTE — ED Notes (Signed)
Pt alerted RN that she is bleeding from her rectum when wiping during BM. EDP notified.

## 2022-03-04 NOTE — Discharge Instructions (Addendum)
Increase your fiber intake to help promote normal healthy stools.  Use the lactulose as needed for constipation.  Follow with primary provider for ongoing symptoms.

## 2022-03-04 NOTE — ED Notes (Signed)
Pt reports it has been since the 23rd that they have had BM; pt reports at times abdomen feels tight; sometimes tender; urination has been regular per pt. Pt in NAD.

## 2022-03-04 NOTE — ED Notes (Signed)
Bedside commode switched out for pt as stated it felt too narrow.

## 2022-07-07 ENCOUNTER — Emergency Department
Admission: EM | Admit: 2022-07-07 | Discharge: 2022-07-07 | Disposition: A | Discharge status: Home / Self Care | Payer: Medicaid Other | Attending: Emergency Medicine | Admitting: Emergency Medicine

## 2022-07-07 ENCOUNTER — Emergency Department: Payer: Medicaid Other

## 2022-07-07 DIAGNOSIS — F840 Autistic disorder: Secondary | ICD-10-CM | POA: Diagnosis not present

## 2022-07-07 DIAGNOSIS — M25561 Pain in right knee: Secondary | ICD-10-CM | POA: Insufficient documentation

## 2022-07-07 MED ORDER — MELOXICAM 15 MG PO TABS: 15.0000 mg | ORAL_TABLET | Freq: Every day | ORAL | 11 refills | Status: AC

## 2022-07-07 NOTE — ED Triage Notes (Signed)
Pt states they have right knee pain since yesterday. Pt states they have been on their leg more than normal. Pt states it feels like their leg needs to pop and it cannot.  Denies injury/trauma.

## 2022-07-07 NOTE — ED Provider Notes (Signed)
[facility] Provider Note    Event Date/Time   First MD Initiated Contact with Patient 07/07/22 [phone]     (approximate)   History   Chief Complaint No chief complaint on file.   HPI Jack Wolf is a 27 y.o. adult, history of ADHD, autism, presents to the emergency department for evaluation of right knee pain.  She states that she has been on her leg more than normal with work, though denies any acute falls or injuries.  She is currently nursing pain along the joint line of her right knee since yesterday.  She states that every now and then it feels like it needs to pop, however it cannot.  She is still able to ambulate on it.  Denies fever/chills, chest pain, shortness of breath, abdominal pain, paresthesias, dizziness/lightheadedness, weakness, hip pain, thigh pain, or foot pain.  History Limitations: No limitations.        Physical Exam  Triage Vital Signs: ED Triage Vitals  Enc Vitals Group     BP 07/07/22 1549 (!) 158/90     Pulse Rate 07/07/22 1549 88     Resp 07/07/22 1549 16     Temp 07/07/22 1549 98 F (36.7 C)     Temp Source 07/07/22 1549 Oral     SpO2 07/07/22 1549 100 %     Weight 07/07/22 1548 (!) 325 lb (147.4 kg)     Height 07/07/22 1548 5\' 11"  (1.803 m)     Head Circumference --      Peak Flow --      Pain Score 07/07/22 1548 10     Pain Loc --      Pain Edu? --      Excl. in [place]? --     Most recent vital signs: Vitals:   07/07/22 1549  BP: (!) 158/90  Pulse: 88  Resp: 16  Temp: 98 F (36.7 C)  SpO2: 100%    General: Awake, NAD.  Skin: Warm, dry. No rashes or lesions.  Eyes: PERRL. Conjunctivae normal.  CV: Good peripheral perfusion.  Resp: Normal effort.  Abd: Soft, non-tender. No distention.  Neuro: At baseline. No gross neurological deficits.  Musculoskeletal: Normal ROM of all extremities.  Focused Exam: No gross deformities to the right knee.  Normal range of motion including flexion and extension.  She is  still able to ambulate on it.  PMS intact distally.  Negative anterior/posterior drawer.  There does appear to be some pain with Lockman maneuver along the joint line.  Physical Exam    ED Results / Procedures / Treatments  Labs (all labs ordered are listed, but only abnormal results are displayed) Labs Reviewed - No data to display   EKG N/A.    RADIOLOGY  ED Provider Interpretation: I personally reviewed and interpreted this x-ray, no evidence of acute fractures.  DG Knee Complete 4 Views Right  Result Date: 07/07/2022 CLINICAL DATA:  Right knee pain since yesterday. EXAM: RIGHT KNEE - COMPLETE 4+ VIEW COMPARISON:  None Available. FINDINGS: No evidence of fracture, dislocation, or joint effusion. No evidence of arthropathy or other focal bone abnormality. Soft tissues are unremarkable. IMPRESSION: Negative. Electronically Signed   By: Lajean Manes M.D.   On: 07/07/2022 16:19    PROCEDURES:  Critical Care performed: N/A.  Procedures    MEDICATIONS ORDERED IN ED: Medications - No data to display   IMPRESSION / MDM / [place] / ED COURSE  I reviewed the triage  vital signs and the nursing notes.                              Differential diagnosis includes, but is not limited to, knee dislocation, patella fracture, tibial plateau fracture, ACL/PCL tear, LCL/MCL injury, meniscal injury, bursitis, Osgood Slaughter, patellofemoral syndrome, patellar tendinitis.  Assessment/Plan Patient presents with right knee pain x 2 days.  She does have some tenderness with manipulation of the knee joint on exam, however no signs of joint laxity.  No signs of ACL/PCL or LCL/MCL ruptures.  X-ray does not show any acute abnormalities.  She still able to ambulate on her own.  Suspect likely knee sprain versus patellar tendinitis.  Will provide her with a knee brace for comfort and provided with a prescription for meloxicam for the pain.  Advised her to follow-up with orthopedics if  her symptoms fail to improve after a few weeks.  She was amenable to this.  Provided with a work note.  Will discharge.  Provided the patient with anticipatory guidance, return precautions, and educational material. Encouraged the patient to return to the emergency department at any time if they begin to experience any new or worsening symptoms. Patient expressed understanding and agreed with the plan.   Patient's presentation is most consistent with acute complicated illness / injury requiring diagnostic workup.       FINAL CLINICAL IMPRESSION(S) / ED DIAGNOSES   Final diagnoses:  Acute pain of right knee     Rx / DC Orders   ED Discharge Orders          Ordered    meloxicam (MOBIC) 15 MG tablet  Daily        07/07/22 1656             Note:  This document was prepared using Dragon voice recognition software and may include unintentional dictation errors.   Teodoro Spray, [place] 07/07/22 1709    Naaman Plummer, MD 07/07/22 2158

## 2022-07-07 NOTE — Discharge Instructions (Addendum)
-  Your x-ray did not show any signs of fractures or major soft tissue injuries.  I suspect that you likely have a knee sprain or possibly a meniscal injury.  Recommend keeping the knee brace on while you are walking around.  Please review the educational material.  -You may take the meloxicam as needed for pain.  If your symptoms fail to improve after a few weeks, please follow-up with the orthopedic listed on this page and schedule appointment.  -Return to the emergency department anytime if you begin to experience any new or worsening symptoms.

## 2022-07-17 ENCOUNTER — Emergency Department
Admission: EM | Admit: 2022-07-17 | Discharge: 2022-07-17 | Disposition: A | Discharge status: Home / Self Care | Payer: No Typology Code available for payment source | Attending: Emergency Medicine | Admitting: Emergency Medicine

## 2022-07-17 ENCOUNTER — Other Ambulatory Visit: Payer: Self-pay

## 2022-07-17 ENCOUNTER — Encounter: Payer: Self-pay | Admitting: Emergency Medicine

## 2022-07-17 DIAGNOSIS — W268XXA Contact with other sharp object(s), not elsewhere classified, initial encounter: Secondary | ICD-10-CM | POA: Insufficient documentation

## 2022-07-17 DIAGNOSIS — S060X0A Concussion without loss of consciousness, initial encounter: Secondary | ICD-10-CM | POA: Diagnosis not present

## 2022-07-17 DIAGNOSIS — F840 Autistic disorder: Secondary | ICD-10-CM | POA: Insufficient documentation

## 2022-07-17 DIAGNOSIS — Y92512 Supermarket, store or market as the place of occurrence of the external cause: Secondary | ICD-10-CM | POA: Diagnosis not present

## 2022-07-17 DIAGNOSIS — S0990XA Unspecified injury of head, initial encounter: Secondary | ICD-10-CM | POA: Insufficient documentation

## 2022-07-17 DIAGNOSIS — S01111A Laceration without foreign body of right eyelid and periocular area, initial encounter: Secondary | ICD-10-CM | POA: Diagnosis not present

## 2022-07-17 DIAGNOSIS — S00201A Unspecified superficial injury of right eyelid and periocular area, initial encounter: Secondary | ICD-10-CM | POA: Diagnosis present

## 2022-07-17 MED ORDER — ONDANSETRON 4 MG PO TBDP
4.0000 mg | ORAL_TABLET | Freq: Once | ORAL | Status: AC
  Administered 2022-07-17: 4 mg via ORAL
  Filled 2022-07-17: qty 1

## 2022-07-17 MED ORDER — ACETAMINOPHEN 500 MG PO TABS
1000.0000 mg | ORAL_TABLET | Freq: Once | ORAL | Status: AC
  Administered 2022-07-17: 1000 mg via ORAL
  Filled 2022-07-17: qty 2

## 2022-07-17 NOTE — ED Notes (Signed)
Spoke with Leana Gamer  States no drug screen needed

## 2022-07-17 NOTE — ED Provider Notes (Signed)
[facility] Provider Note    Event Date/Time   First MD Initiated Contact with Patient 07/17/22 [phone]     (approximate)   History   Chief Complaint Laceration   HPI Jack Wolf is a 27 y.o. adult, history of ADHD, autism, mood disorder, presents to the emergency department for evaluation of eyebrow laceration/head injury.  She states that she is employed with [employer] and was at [facility] remodeling when she was hit with a shelf.  Denies loss of consciousness.  Currently endorsing mild headache with mild nausea, otherwise states that she feels well.  Denies neck pain, chest pain, shortness of breath, abdominal pain, paresthesias, vision change, hearing changes, or dizziness/lightheadedness.  History Limitations: No limitations.        Physical Exam  Triage Vital Signs: ED Triage Vitals  Enc Vitals Group     BP 07/17/22 0624 (!) 159/90     Pulse Rate 07/17/22 0624 (!) 103     Resp 07/17/22 0624 18     Temp 07/17/22 0624 97.9 F (36.6 C)     Temp Source 07/17/22 0624 Oral     SpO2 07/17/22 0624 97 %     Weight 07/17/22 0623 (!) 362 lb (164.2 kg)     Height 07/17/22 0623 5' 11"$  (1.803 m)     Head Circumference --      Peak Flow --      Pain Score 07/17/22 0623 7     Pain Loc --      Pain Edu? --      Excl. in [place]? --     Most recent vital signs: Vitals:   07/17/22 0624  BP: (!) 159/90  Pulse: (!) 103  Resp: 18  Temp: 97.9 F (36.6 C)  SpO2: 97%    General: Awake, NAD.  Skin: Warm, dry. No rashes or lesions.  Eyes: PERRL. Conjunctivae normal.  CV: Good peripheral perfusion.  Resp: Normal effort.  Abd: Soft, non-tender. No distention.  Neuro: At baseline. No gross neurological deficits.  Musculoskeletal: Normal ROM of all extremities.  Focused Exam: 1 cm linear laceration along the medial aspect of the right eyebrow, very superficial, approximately 0.5/1 mm deep.  No active bleeding or discharge.  No surrounding erythema, ecchymosis,  or crepitus.  Normal EOMI.  Physical Exam    ED Results / Procedures / Treatments  Labs (all labs ordered are listed, but only abnormal results are displayed) Labs Reviewed - No data to display   EKG N/A.    RADIOLOGY  ED Provider Interpretation: N/A.  No results found.  PROCEDURES:  Critical Care performed: N/A.  Marland Kitchen.Laceration Repair  Date/Time: 07/17/2022 8:17 AM  Performed by: Teodoro Spray, PA Authorized by: Teodoro Spray, PA   Consent:    Consent obtained:  Verbal   Consent given by:  Patient   Risks, benefits, and alternatives were discussed: yes     Risks discussed:  Infection, need for additional repair, nerve damage, poor wound healing, poor cosmetic result, pain, retained foreign body, tendon damage and vascular damage   Alternatives discussed:  No treatment Universal protocol:    Patient identity confirmed:  Verbally with patient Anesthesia:    Anesthesia method:  None Laceration details:    Location:  Face   Face location:  R eyebrow   Length (cm):  1   Depth (mm):  1 Exploration:    Hemostasis achieved with:  Direct pressure   Wound exploration: wound explored through full range of  motion and entire depth of wound visualized     Wound extent: fascia not violated, no foreign body, no signs of injury, no nerve damage, no tendon damage, no underlying fracture and no vascular damage   Treatment:    Irrigation solution:  Sterile saline   Irrigation volume:  100   Irrigation method:  Syringe   Debridement:  None   Undermining:  None Skin repair:    Repair method:  Tissue adhesive Approximation:    Approximation:  Close Repair type:    Repair type:  Simple Post-procedure details:    Dressing:  Adhesive bandage   Procedure completion:  Tolerated well, no immediate complications     MEDICATIONS ORDERED IN ED: Medications  acetaminophen (TYLENOL) tablet 1,000 mg (1,000 mg Oral Given 07/17/22 0807)  ondansetron (ZOFRAN-ODT)  disintegrating tablet 4 mg (4 mg Oral Given 07/17/22 0807)     IMPRESSION / MDM / ASSESSMENT AND PLAN / ED COURSE  I reviewed the triage vital signs and the nursing notes.                              Differential diagnosis includes, but is not limited to, laceration, concussion, head injury, maxillofacial fracture, epidural/subdural hematoma.   Assessment/Plan Patient presents with superficial, 1 cm linear laceration on the right eyebrow following head injury.  Mechanism does not appear severe.  She denies any loss of consciousness.  No blood thinner usage.  She appears well clinically.  Per Nexus criteria, does not warrant CT imaging.  Her laceration appears very superficial, I do not believe repair with sutures is needed at this time.  Repaired utilizing Dermabond glue.  Patient tolerated the procedure well with no immediate complications.  She does report some mild headache and mild nausea.  I suspect that she likely endured a concussion.  Provided her with anticipatory guidance and strict return precautions.  Encouraged her to follow-up with her primary care provider as needed if her symptoms fail to improve after a few weeks.  Will discharge.  Provided the patient with anticipatory guidance, return precautions, and educational material. Encouraged the patient to return to the emergency department at any time if they begin to experience any new or worsening symptoms. Patient expressed understanding and agreed with the plan.   Patient's presentation is most consistent with acute complicated illness / injury requiring diagnostic workup.       FINAL CLINICAL IMPRESSION(S) / ED DIAGNOSES   Final diagnoses:  Eyebrow laceration, right, initial encounter  Concussion without loss of consciousness, initial encounter     Rx / DC Orders   ED Discharge Orders     None        Note:  This document was prepared using Dragon voice recognition software and may include unintentional  dictation errors.   Teodoro Spray, [place] 07/17/22 [id number]    Blake Divine, MD 07/17/22 [phone]

## 2022-07-17 NOTE — Discharge Instructions (Addendum)
-  The Dermabond glue will dissolve on its own in 5 to 7 days.  No further interventions are needed at this time.  Do not apply any topical ointments, lotions, creams, or emollients as this will cause the glue to dissolve prematurely.  -Given your symptoms, I do suspect that you likely endured a concussion.  Please review the educational material.  Your symptoms should resolve within the next few weeks.  Until then, you may take Tylenol as needed for pain.  If your symptoms do not resolve, please follow-up with your primary care provider as needed.  -Return to the emergency department anytime if you begin to experience any new or worsening symptoms.

## 2022-07-17 NOTE — ED Triage Notes (Addendum)
Patient ambulatory to triage with steady gait, without difficulty or distress noted; pt reports employed with [employer]; PTA was at [facility], "remodeling" and was hit; small laceration noted to rt eyebrow; denies LOC; pt does not have workers comp requirements at this time; informed to f/u with her supervisor regarding such

## 2022-09-01 ENCOUNTER — Ambulatory Visit: Payer: Medicaid Other

## 2022-09-10 ENCOUNTER — Ambulatory Visit: Payer: Medicaid Other

## 2022-10-18 ENCOUNTER — Other Ambulatory Visit: Payer: Self-pay

## 2022-10-18 ENCOUNTER — Emergency Department
Admission: EM | Admit: 2022-10-18 | Discharge: 2022-10-18 | Disposition: A | Discharge status: Home / Self Care | Payer: Medicaid Other | Attending: Emergency Medicine | Admitting: Emergency Medicine

## 2022-10-18 DIAGNOSIS — H6501 Acute serous otitis media, right ear: Secondary | ICD-10-CM | POA: Diagnosis not present

## 2022-10-18 DIAGNOSIS — H6500 Acute serous otitis media, unspecified ear: Secondary | ICD-10-CM

## 2022-10-18 DIAGNOSIS — H9201 Otalgia, right ear: Secondary | ICD-10-CM | POA: Diagnosis present

## 2022-10-18 MED ORDER — OXYCODONE-ACETAMINOPHEN 5-325 MG PO TABS
1.0000 | ORAL_TABLET | Freq: Once | ORAL | Status: AC
  Administered 2022-10-18: 1 via ORAL
  Filled 2022-10-18: qty 1

## 2022-10-18 MED ORDER — TRAMADOL HCL 50 MG PO TABS: 50.0000 mg | ORAL_TABLET | Freq: Four times a day (QID) | ORAL | 0 refills | Status: DC | PRN

## 2022-10-18 MED ORDER — AMOXICILLIN 875 MG PO TABS: 875.0000 mg | ORAL_TABLET | Freq: Two times a day (BID) | ORAL | 0 refills | Status: AC

## 2022-10-18 NOTE — ED Triage Notes (Signed)
Pt presents to ER with c/o right side hearing loss, and right side jaw pain that started around 0900 yesterday, and has been getting worse.  Pt states she has attempted some at home remedies in the case she has an earwax impaction, but without success.  Pt is otherwise A&O x4 and in NAD in triage.

## 2022-10-18 NOTE — Discharge Instructions (Signed)
Follow up with your regular doctor if not improving in 3 days, return if worsening 

## 2022-10-18 NOTE — ED Provider Notes (Signed)
[facility] Provider Note    Event Date/Time   First MD Initiated Contact with Patient 10/18/22 [phone]     (approximate)   History   Hearing Problem and Jaw Pain   HPI  Jack Wolf is a 27 y.o. adult presents emergency department complaining of right ear pain that started yesterday.  States pain was severe.  Used over-the-counter medications thinking it was wax buildup.  Denies fever or chills.  States pain radiates into the jaw.  No vomiting or diarrhea.      Physical Exam   Triage Vital Signs: ED Triage Vitals [10/18/22 0350]  Enc Vitals Group     BP (!) 155/97     Pulse Rate 67     Resp 17     Temp 98.6 F (37 C)     Temp Source Oral     SpO2 99 %     Weight (!) 365 lb (165.6 kg)     Height 5\' 9"  (1.753 m)     Head Circumference      Peak Flow      Pain Score 8     Pain Loc      Pain Edu?      Excl. in [place]?     Most recent vital signs: Vitals:   10/18/22 0350  BP: (!) 155/97  Pulse: 67  Resp: 17  Temp: 98.6 F (37 C)  SpO2: 99%     General: Awake, no distress.   CV:  Good peripheral perfusion. regular rate and  rhythm Resp:  Normal effort. Lungs cta Abd:  No distention.   Other:  Right TM is bright red swollen, cannot see any landmarks on the TM, neck supple, no lymphadenopathy, no mastoid tenderness   ED Results / Procedures / Treatments   Labs (all labs ordered are listed, but only abnormal results are displayed) Labs Reviewed - No data to display   EKG     RADIOLOGY     PROCEDURES:   Procedures   MEDICATIONS ORDERED IN ED: Medications  oxyCODONE-acetaminophen (PERCOCET/ROXICET) 5-325 MG per tablet 1 tablet (has no administration in time range)     IMPRESSION / MDM / ASSESSMENT AND PLAN / ED COURSE  I reviewed the triage vital signs and the nursing notes.                              Differential diagnosis includes, but is not limited to, acute otitis media, cerumen impaction,  barotrauma  Patient's presentation is most consistent with acute, uncomplicated illness.   Patient's exam is consistent with otitis media.  Patient was given Percocet while here in the ED due to the amount of pain.  Will be given a prescription for amoxicillin and tramadol.  He is to take over-the-counter Tylenol and ibuprofen also.  Follow-up with his regular doctor if not improving in 3 days.  Return emergency department worsening.  Discharged in stable condition.      FINAL CLINICAL IMPRESSION(S) / ED DIAGNOSES   Final diagnoses:  Acute serous otitis media, recurrence not specified, unspecified laterality     Rx / DC Orders   ED Discharge Orders          Ordered    amoxicillin (AMOXIL) 875 MG tablet  2 times daily        10/18/22 0713    traMADol (ULTRAM) 50 MG tablet  Every 6 hours PRN  10/18/22 1610             Note:  This document was prepared using Dragon voice recognition software and may include unintentional dictation errors.    Faythe Ghee, PA-C 10/18/22 9604    Sharyn Creamer, MD 10/18/22 [phone]

## 2023-05-25 ENCOUNTER — Ambulatory Visit: Payer: MEDICAID | Admitting: Internal Medicine

## 2023-05-29 NOTE — Progress Notes (Signed)
No show

## 2023-08-28 NOTE — Progress Notes (Signed)
 No show

## 2023-08-31 ENCOUNTER — Ambulatory Visit: Payer: MEDICAID | Admitting: Internal Medicine

## 2023-09-02 ENCOUNTER — Ambulatory Visit: Payer: MEDICAID | Admitting: Podiatry

## 2023-09-08 ENCOUNTER — Ambulatory Visit: Payer: MEDICAID | Admitting: Podiatry
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
## 2023-11-23 ENCOUNTER — Ambulatory Visit (INDEPENDENT_AMBULATORY_CARE_PROVIDER_SITE_OTHER): Payer: MEDICAID | Admitting: Internal Medicine

## 2023-11-23 VITALS — BP 132/87 | HR 89 | Resp 16 | Ht 70.0 in | Wt >= 6400 oz

## 2023-11-23 DIAGNOSIS — G4733 Obstructive sleep apnea (adult) (pediatric): Secondary | ICD-10-CM | POA: Diagnosis not present

## 2023-11-23 DIAGNOSIS — Z7189 Other specified counseling: Secondary | ICD-10-CM | POA: Diagnosis not present

## 2023-11-23 NOTE — Patient Instructions (Signed)

## 2024-03-04 ENCOUNTER — Ambulatory Visit: Payer: MEDICAID
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
## 2024-05-09 ENCOUNTER — Ambulatory Visit: Payer: MEDICAID
